# Patient Record
Sex: Female | Born: 1985 | Race: Black or African American | Hispanic: No | Marital: Married | Smoking: Never smoker
Health system: Southern US, Community
[De-identification: ages and names within clinical notes are randomized; demographics above are authoritative.]

## PROBLEM LIST (undated history)

## (undated) DIAGNOSIS — E119 Type 2 diabetes mellitus without complications: Secondary | ICD-10-CM

## (undated) DIAGNOSIS — R569 Unspecified convulsions: Secondary | ICD-10-CM

## (undated) DIAGNOSIS — E785 Hyperlipidemia, unspecified: Secondary | ICD-10-CM

## (undated) DIAGNOSIS — J302 Other seasonal allergic rhinitis: Secondary | ICD-10-CM

## (undated) DIAGNOSIS — F419 Anxiety disorder, unspecified: Secondary | ICD-10-CM

## (undated) HISTORY — DX: Hyperlipidemia, unspecified: E78.5

## (undated) HISTORY — DX: Anxiety disorder, unspecified: F41.9

## (undated) HISTORY — DX: Unspecified convulsions: R56.9

## (undated) HISTORY — PX: INNER EAR SURGERY: SHX679

## (undated) HISTORY — DX: Other seasonal allergic rhinitis: J30.2

---

## 2011-03-10 ENCOUNTER — Emergency Department: Payer: Self-pay

## 2012-05-11 ENCOUNTER — Emergency Department: Payer: Self-pay | Admitting: Emergency Medicine

## 2012-05-11 LAB — URINALYSIS, COMPLETE
Bilirubin,UR: NEGATIVE
Blood: NEGATIVE
Glucose,UR: >=500 mg/dL (ref 0–75)
Ketone: NEGATIVE
Leukocyte Esterase: NEGATIVE
Nitrite: NEGATIVE
Ph: 6 (ref 4.5–8.0)
Protein: NEGATIVE
RBC,UR: <1 /HPF (ref 0–5)
Specific Gravity: 1.031 (ref 1.003–1.030)
Squamous Epithelial: 2
WBC UR: 1 /HPF (ref 0–5)

## 2012-05-12 LAB — CBC
MCH: 24 pg — ABNORMAL LOW (ref 26.0–34.0)
MCV: 80 fL (ref 80–100)
Platelet: 258 10*3/uL (ref 150–440)
RBC: 4.66 10*6/uL (ref 3.80–5.20)
RDW: 16.6 % — ABNORMAL HIGH (ref 11.5–14.5)
WBC: 10.1 10*3/uL (ref 3.6–11.0)

## 2012-05-12 LAB — COMPREHENSIVE METABOLIC PANEL
Albumin: 3.2 g/dL — ABNORMAL LOW (ref 3.4–5.0)
Alkaline Phosphatase: 95 U/L (ref 50–136)
Anion Gap: 7 (ref 7–16)
BUN: 7 mg/dL (ref 7–18)
Calcium, Total: 8.6 mg/dL (ref 8.5–10.1)
Glucose: 332 mg/dL — ABNORMAL HIGH (ref 65–99)
Osmolality: 281 (ref 275–301)
Potassium: 3.6 mmol/L (ref 3.5–5.1)
SGOT(AST): 31 U/L (ref 15–37)
Sodium: 135 mmol/L — ABNORMAL LOW (ref 136–145)
Total Protein: 8.1 g/dL (ref 6.4–8.2)

## 2012-05-12 LAB — PREGNANCY, URINE: Pregnancy Test, Urine: NEGATIVE m[IU]/mL

## 2012-05-12 LAB — LIPASE, BLOOD: Lipase: 199 U/L (ref 73–393)

## 2012-08-29 ENCOUNTER — Ambulatory Visit: Payer: Self-pay | Admitting: Family Medicine

## 2012-09-09 ENCOUNTER — Ambulatory Visit: Payer: Self-pay | Admitting: Family Medicine

## 2012-09-23 ENCOUNTER — Ambulatory Visit: Payer: Self-pay | Admitting: Neurology

## 2012-10-05 ENCOUNTER — Emergency Department: Payer: Self-pay | Admitting: Emergency Medicine

## 2012-10-05 LAB — COMPREHENSIVE METABOLIC PANEL
Albumin: 3.3 g/dL — ABNORMAL LOW (ref 3.4–5.0)
Alkaline Phosphatase: 72 U/L (ref 50–136)
BUN: 10 mg/dL (ref 7–18)
Calcium, Total: 8.8 mg/dL (ref 8.5–10.1)
Creatinine: 0.78 mg/dL (ref 0.60–1.30)
EGFR (African American): >60
EGFR (Non-African Amer.): >60
Glucose: 138 mg/dL — ABNORMAL HIGH (ref 65–99)
Osmolality: 277 (ref 275–301)
Potassium: 3.8 mmol/L (ref 3.5–5.1)
SGOT(AST): 23 U/L (ref 15–37)
Sodium: 138 mmol/L (ref 136–145)
Total Protein: 8.3 g/dL — ABNORMAL HIGH (ref 6.4–8.2)

## 2012-10-05 LAB — DRUG SCREEN, URINE
Barbiturates, Ur Screen: NEGATIVE (ref ?–200)
Cannabinoid 50 Ng, Ur ~~LOC~~: NEGATIVE (ref ?–50)
Cocaine Metabolite,Ur ~~LOC~~: NEGATIVE (ref ?–300)
Methadone, Ur Screen: NEGATIVE (ref ?–300)
Opiate, Ur Screen: NEGATIVE (ref ?–300)
Phencyclidine (PCP) Ur S: NEGATIVE (ref ?–25)
Tricyclic, Ur Screen: NEGATIVE (ref ?–1000)

## 2012-10-05 LAB — URINALYSIS, COMPLETE
Bacteria: NONE SEEN
Bilirubin,UR: NEGATIVE
Blood: NEGATIVE
Glucose,UR: NEGATIVE mg/dL (ref 0–75)
Leukocyte Esterase: NEGATIVE
Ph: 5 (ref 4.5–8.0)
Protein: 30
Specific Gravity: 1.029 (ref 1.003–1.030)
Squamous Epithelial: 2

## 2012-10-05 LAB — CBC
MCH: 24.5 pg — ABNORMAL LOW (ref 26.0–34.0)
MCHC: 31.6 g/dL — ABNORMAL LOW (ref 32.0–36.0)
MCV: 78 fL — ABNORMAL LOW (ref 80–100)
Platelet: 381 10*3/uL (ref 150–440)
WBC: 11.7 10*3/uL — ABNORMAL HIGH (ref 3.6–11.0)

## 2012-10-08 ENCOUNTER — Ambulatory Visit: Payer: Self-pay | Admitting: Neurology

## 2013-04-11 ENCOUNTER — Emergency Department: Payer: Self-pay | Admitting: Emergency Medicine

## 2013-04-11 LAB — URINALYSIS, COMPLETE
Bacteria: NONE SEEN
Blood: NEGATIVE
Glucose,UR: NEGATIVE mg/dL (ref 0–75)
Ketone: NEGATIVE
Nitrite: NEGATIVE
Ph: 5 (ref 4.5–8.0)
Squamous Epithelial: 3

## 2013-04-11 LAB — CBC
MCHC: 32 g/dL (ref 32.0–36.0)
MCV: 76 fL — ABNORMAL LOW (ref 80–100)
RBC: 4.18 10*6/uL (ref 3.80–5.20)
RDW: 16.5 % — ABNORMAL HIGH (ref 11.5–14.5)
WBC: 12.9 10*3/uL — ABNORMAL HIGH (ref 3.6–11.0)

## 2013-04-11 LAB — COMPREHENSIVE METABOLIC PANEL
Albumin: 3 g/dL — ABNORMAL LOW (ref 3.4–5.0)
Anion Gap: 6 — ABNORMAL LOW (ref 7–16)
Bilirubin,Total: <0.1 mg/dL — ABNORMAL LOW (ref 0.2–1.0)
Chloride: 105 mmol/L (ref 98–107)
Co2: 23 mmol/L (ref 21–32)
Creatinine: 0.57 mg/dL — ABNORMAL LOW (ref 0.60–1.30)
EGFR (African American): >60
EGFR (Non-African Amer.): >60
Glucose: 100 mg/dL — ABNORMAL HIGH (ref 65–99)
Osmolality: 268 (ref 275–301)
Potassium: 3.5 mmol/L (ref 3.5–5.1)
SGOT(AST): 12 U/L — ABNORMAL LOW (ref 15–37)
SGPT (ALT): 15 U/L (ref 12–78)
Sodium: 134 mmol/L — ABNORMAL LOW (ref 136–145)
Total Protein: 8.1 g/dL (ref 6.4–8.2)

## 2014-05-19 IMAGING — CT CT ABD-PELV W/O CM
1 of 2 series · 15 of 32 positions shown, 19 images · non-contrast
Comparison: none

REASON FOR EXAM: (1) LLQ/RLQ pain; (2) LLQ/RLQ pain
COMMENTS:

[Series 2: 3mm soft tissue · axial · 0.91mm/px · z∈[-508,-73]mm · 15 of 159 slices shown, 19 images]
[im 7/159  soft-tissue]
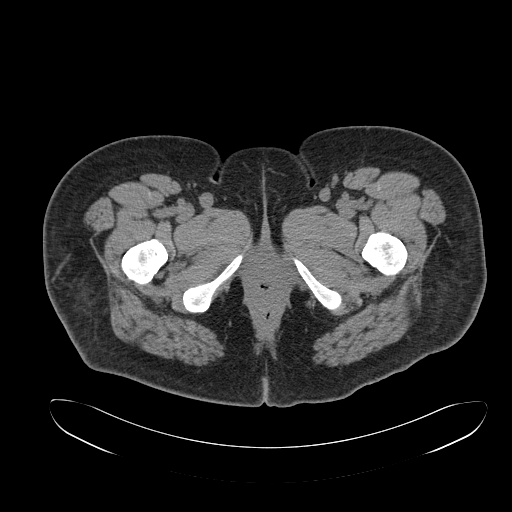
[im 7/159  bone]
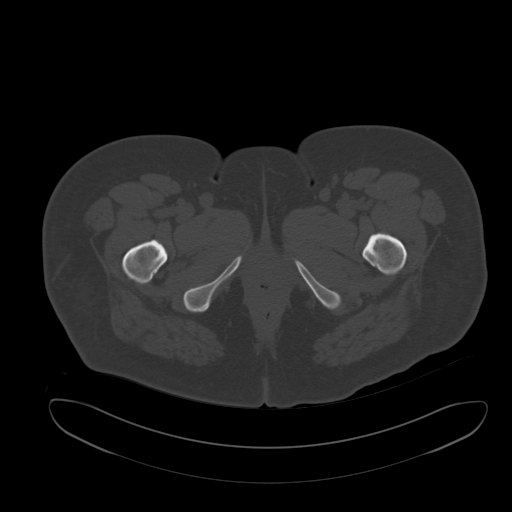
[im 21/159  soft-tissue]
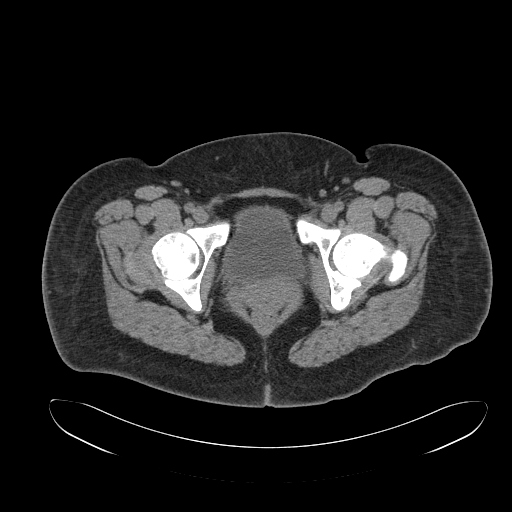
[im 35/159  soft-tissue]
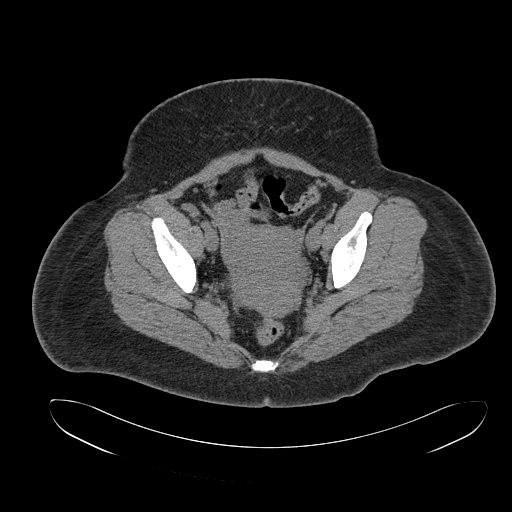
[im 42/159  soft-tissue]
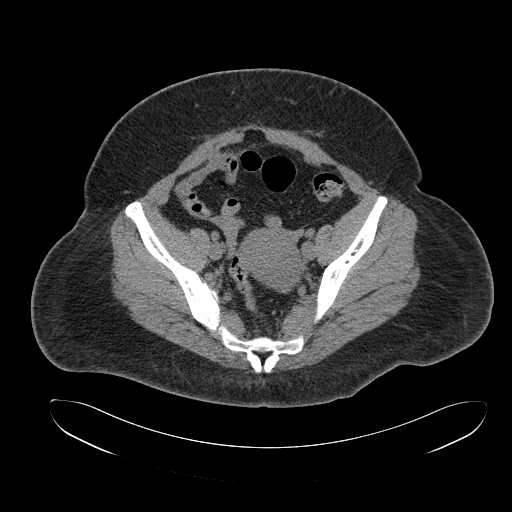
[im 55/159  soft-tissue]
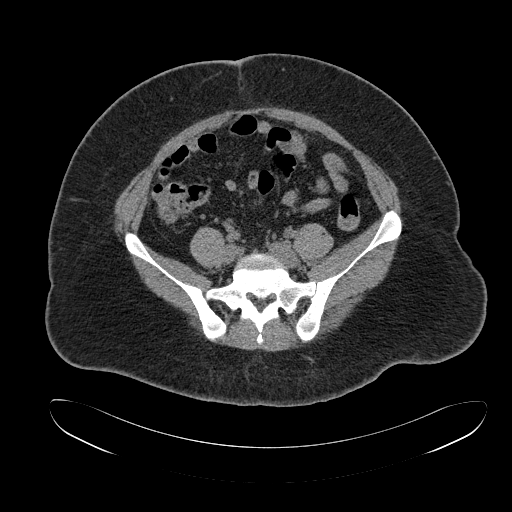
[im 69/159  soft-tissue]
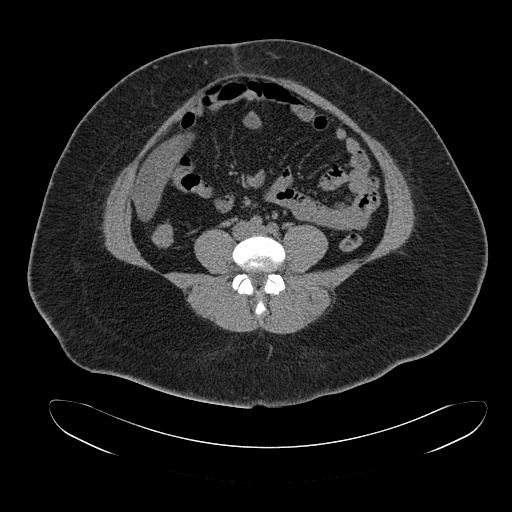
[im 83/159  soft-tissue]
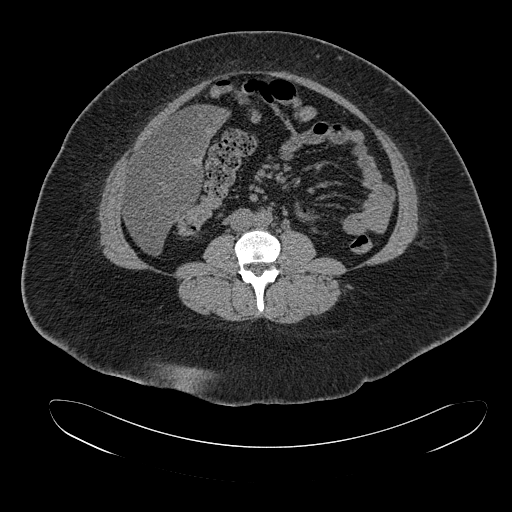
[im 90/159  soft-tissue]
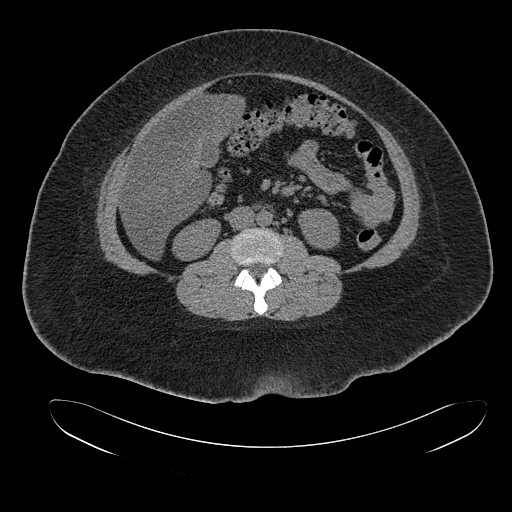
[im 104/159  soft-tissue]
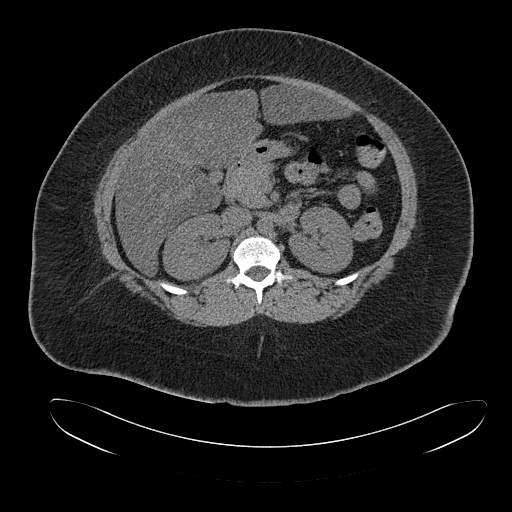
[im 104/159  bone]
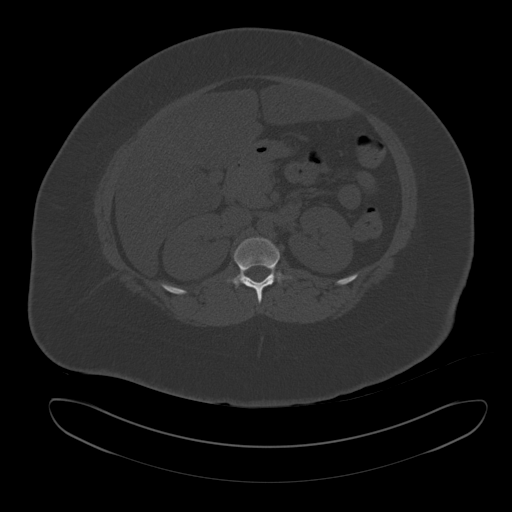
[im 117/159  soft-tissue]
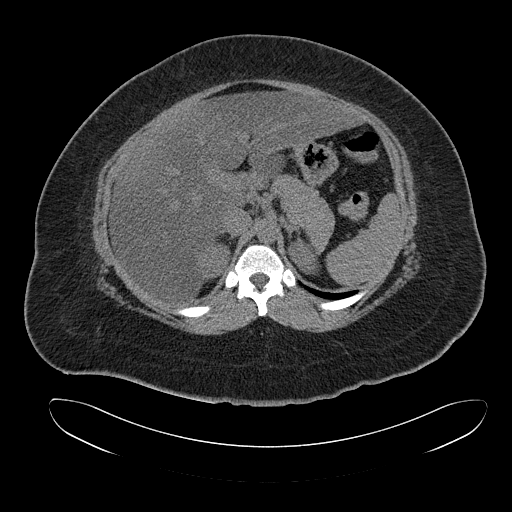
[im 124/159  soft-tissue]
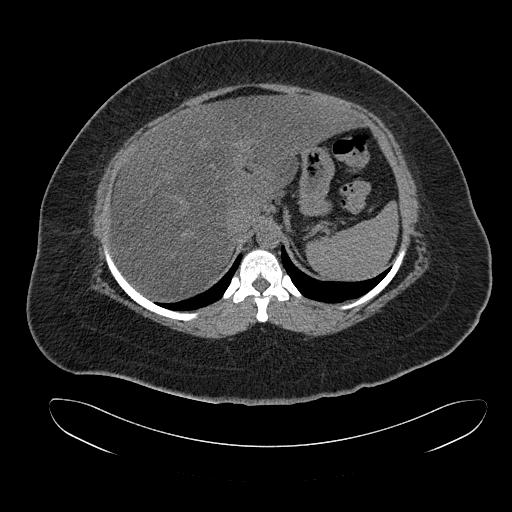
[im 131/159  lung]
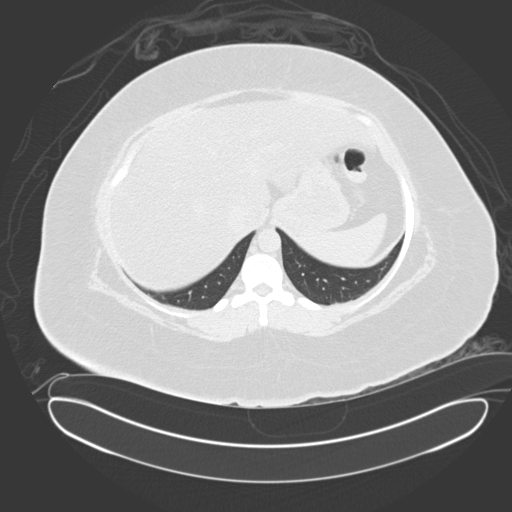
[im 138/159  soft-tissue]
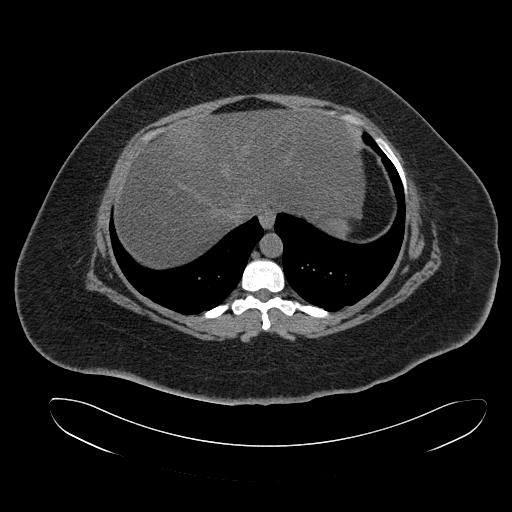
[im 138/159  lung]
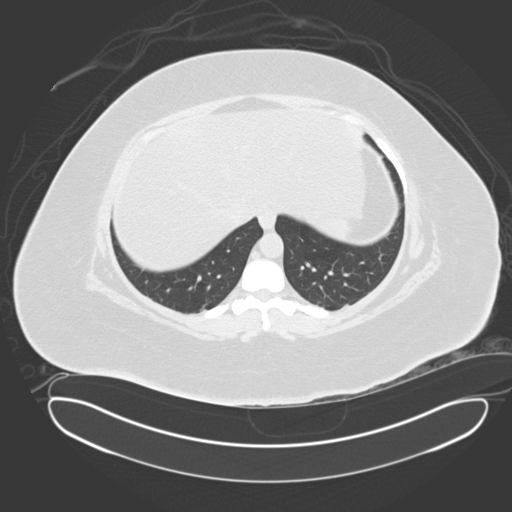
[im 145/159  lung]
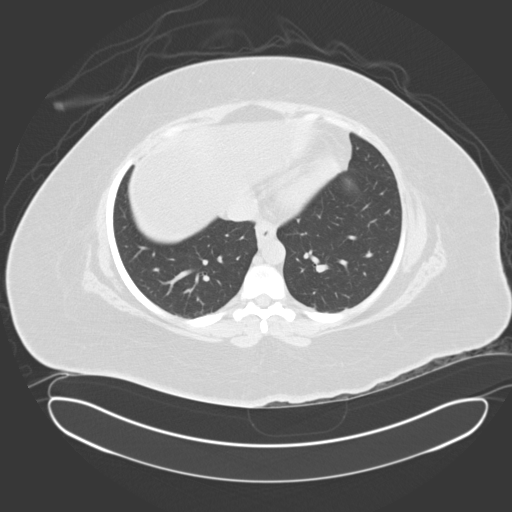
[im 152/159  soft-tissue]
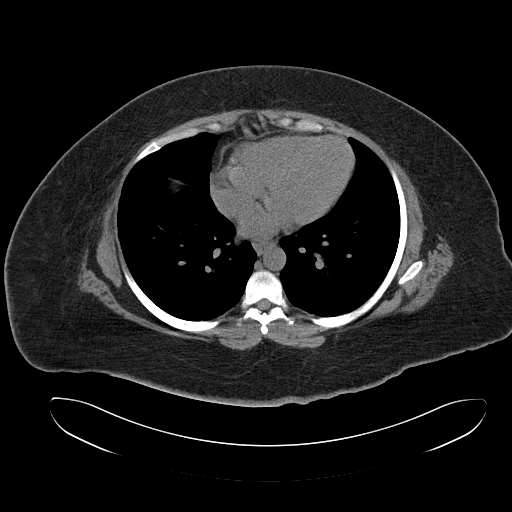
[im 152/159  lung]
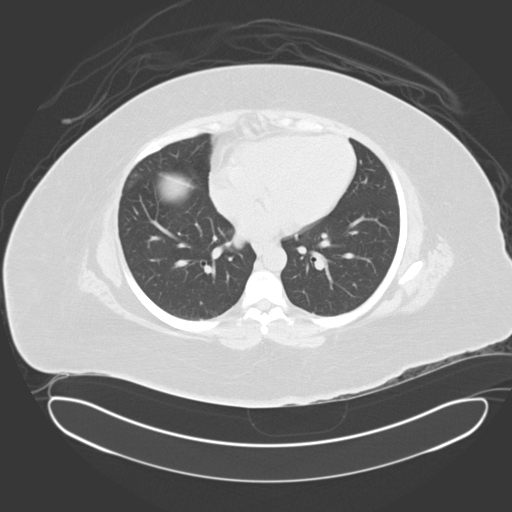

[15 of 32 positions shown; findings below may reference images not displayed]

PROCEDURE:     CT  - CT ABDOMEN AND PELVIS W[DATE]  [DATE]

RESULT:     Axial noncontrast CT scanning was performed through the abdomen
and pelvis with reconstructions at 3 mm intervals and slice thicknesses.
Review of multiplanar reconstructed images was performed separately on the
VIA monitor.

The liver exhibits decreased density diffusely consistent with fatty
infiltration. No focal mass is demonstrated and there is no intrahepatic
ductal dilation. The gallbladder is adequately distended with no evidence of
stones. The pancreas, spleen, nondistended stomach, adrenal glands, and
kidneys exhibit no abnormality on this noncontrast exam. The caliber of the
abdominal aorta is normal. The unopacified loops of small and large bowel
exhibit no evidence of ileus nor of obstruction. There is a structure that
is felt to reflect a normal appendix demonstrated on images 100 through 113.
The uterus and adnexal structures exhibit no acute abnormality. The
partially distended urinary bladder is normal in appearance area there is no
inguinal nor umbilical hernia.

The lung bases are clear. The lumbar vertebral bodies are preserved in
height. The psoas muscles exhibit no evidence of abscess or inflammation.
IMPRESSION: 1. There are fatty infiltrative changes of the liver. I do not see acute
hepatobiliary abnormality.
2. There is no acute urinary tract abnormality.
3. There is no evidence of small or large bowel obstruction or ileus or
gastroenteritis. There are no findings to suggest colitis or diverticulitis.

If the patient's symptoms persist and remain unexplained, a contrast
enhanced CT scan may be useful.

A preliminary report was sent to the [HOSPITAL] the conclusion
of the study.

## 2015-03-07 ENCOUNTER — Emergency Department
Admit: 2015-03-07 | Disposition: A | Discharge status: Home / Self Care | Payer: Self-pay | Admitting: Emergency Medicine

## 2015-03-07 LAB — URINALYSIS, COMPLETE
BACTERIA: NONE SEEN
Bilirubin,UR: NEGATIVE
Blood: NEGATIVE
Glucose,UR: >=500 mg/dL (ref 0–75)
Leukocyte Esterase: NEGATIVE
Nitrite: NEGATIVE
Ph: 6 (ref 4.5–8.0)
Protein: NEGATIVE
Specific Gravity: 1.035 (ref 1.003–1.030)

## 2015-03-07 LAB — CBC
HCT: 38.5 % (ref 35.0–47.0)
HGB: 12.2 g/dL (ref 12.0–16.0)
MCH: 25.2 pg — ABNORMAL LOW (ref 26.0–34.0)
MCHC: 31.7 g/dL — AB (ref 32.0–36.0)
MCV: 80 fL (ref 80–100)
Platelet: 306 10*3/uL (ref 150–440)
RBC: 4.84 10*6/uL (ref 3.80–5.20)
RDW: 15.3 % — ABNORMAL HIGH (ref 11.5–14.5)
WBC: 10.3 10*3/uL (ref 3.6–11.0)

## 2015-03-07 LAB — COMPREHENSIVE METABOLIC PANEL
ALBUMIN: 3.6 g/dL
ANION GAP: 7 (ref 7–16)
Alkaline Phosphatase: 65 U/L
BUN: 10 mg/dL
Bilirubin,Total: 0.4 mg/dL
CREATININE: 0.61 mg/dL
Calcium, Total: 9 mg/dL
Chloride: 101 mmol/L
Co2: 25 mmol/L
EGFR (African American): >60
EGFR (Non-African Amer.): >60
Glucose: 434 mg/dL — ABNORMAL HIGH
Potassium: 4.1 mmol/L
SGOT(AST): 23 U/L
SGPT (ALT): 17 U/L
Sodium: 133 mmol/L — ABNORMAL LOW
TOTAL PROTEIN: 7.8 g/dL

## 2015-11-11 LAB — HM PAP SMEAR: HM PAP: NORMAL

## 2016-04-13 ENCOUNTER — Encounter: Payer: Self-pay | Admitting: Emergency Medicine

## 2016-04-13 ENCOUNTER — Emergency Department
Admission: EM | Admit: 2016-04-13 | Discharge: 2016-04-13 | Disposition: A | Discharge status: Home / Self Care | Payer: Managed Care, Other (non HMO) | Attending: Emergency Medicine | Admitting: Emergency Medicine

## 2016-04-13 DIAGNOSIS — J069 Acute upper respiratory infection, unspecified: Secondary | ICD-10-CM | POA: Insufficient documentation

## 2016-04-13 DIAGNOSIS — E119 Type 2 diabetes mellitus without complications: Secondary | ICD-10-CM | POA: Diagnosis not present

## 2016-04-13 DIAGNOSIS — R05 Cough: Secondary | ICD-10-CM | POA: Diagnosis present

## 2016-04-13 HISTORY — DX: Type 2 diabetes mellitus without complications: E11.9

## 2016-04-13 MED ORDER — AZITHROMYCIN 250 MG PO TABS: ORAL_TABLET | ORAL | Status: DC

## 2016-04-13 MED ORDER — IBUPROFEN 800 MG PO TABS: 800.0000 mg | ORAL_TABLET | Freq: Three times a day (TID) | ORAL | Status: DC | PRN

## 2016-04-13 MED ORDER — GUAIFENESIN-CODEINE 100-10 MG/5ML PO SOLN: 10.0000 mL | ORAL | Status: DC | PRN

## 2016-04-13 NOTE — ED Notes (Signed)
Cough and body aches x 3 days. Possible fevers.

## 2016-04-13 NOTE — ED Notes (Signed)
States she developed cough with body aches and some fever about 3 days   Afebrile on arrival

## 2016-04-13 NOTE — ED Provider Notes (Signed)
[facility] Emergency Department Provider Note  ____________________________________________  Time seen: Approximately 10:13 AM  I have reviewed the triage vital signs and the nursing notes.   HISTORY  Chief Complaint Cough    HPI Tamara Tate is a 30 y.o. female since for evaluation of her upper respiratory symptoms. Runny nose congestion drainage and dizziness with change of motion. 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 Positive for fever and chills per her history. States the cough body and on for about 3 days now no relief with over-the-counter medications    Past Medical History  Diagnosis Date  . Diabetes mellitus without complication (HCC)     There are no active problems to display for this patient.   History reviewed. No pertinent past surgical history.  Current Outpatient Rx  Name  Route  Sig  Dispense  Refill  . azithromycin (ZITHROMAX Z-PAK) 250 MG tablet      Take 2 tablets (500 mg) on  Day 1,  followed by 1 tablet (250 mg) once daily on Days 2 through 5.   6 each   0   . guaiFENesin-codeine 100-10 MG/5ML syrup   Oral   Take 10 mLs by mouth every 4 (four) hours as needed for cough.   180 mL   0   . ibuprofen (ADVIL,MOTRIN) 800 MG tablet   Oral   Take 1 tablet (800 mg total) by mouth every 8 (eight) hours as needed.   30 tablet   0     Allergies Review of patient's allergies indicates no known allergies.  No family history on file.  Social History Social History  Substance Use Topics  . Smoking status: Never Smoker   . Smokeless tobacco: None  . Alcohol Use: Yes    Review of Systems Constitutional: Possible fever/chills Eyes: No visual changes. ENT: Positive sore throat probably secondary to drainage per patient has a for runny nose and drainage. Cardiovascular: Denies chest pain. Respiratory: Denies shortness of breath. Positive for  cough Gastrointestinal: No abdominal pain.  No nausea, no vomiting.  No diarrhea.  No constipation. Genitourinary: Negative for dysuria. Musculoskeletal: Negative for back pain. Skin: Negative for rash. Neurological: Negative for headaches, focal weakness or numbness.  10-point ROS otherwise negative.  ____________________________________________   PHYSICAL EXAM:  VITAL SIGNS: ED Triage Vitals  Enc Vitals Group     BP 04/13/16 0933 124/81 mmHg     Pulse Rate 04/13/16 0933 73     Resp 04/13/16 0933 18     Temp 04/13/16 0933 98.3 F (36.8 C)     Temp Source 04/13/16 0933 Oral     SpO2 04/13/16 0933 98 %     Weight 04/13/16 0933 190 lb (86.183 kg)     Height 04/13/16 0933 5\' 2"  (1.575 m)     Head Cir --      Peak Flow --      Pain Score --      Pain Loc --      Pain Edu? --      Excl. in GC? --     Constitutional: Alert and oriented. Well appearing and in no acute distress. Head: Atraumatic. Nose: Mild congestion/rhinnorhea. Mouth/Throat: Mucous membranes are moist.  Oropharynx non-erythematous. Neck: No stridor.  Full range of motion nontender Cardiovascular: Normal rate, regular rhythm. Grossly normal heart sounds.  Good peripheral circulation. Respiratory: Normal respiratory effort.  No retractions. Lungs CTAB. Musculoskeletal:  No lower extremity tenderness nor edema.  No joint effusions. Neurologic:  Normal speech and language. No gross focal neurologic deficits are appreciated. No gait instability. Skin:  Skin is warm, dry and intact. No rash noted. Psychiatric: Mood and affect are normal. Speech and behavior are normal.  ____________________________________________   LABS (all labs ordered are listed, but only abnormal results are displayed)  Labs Reviewed - No data to  display ____________________________________________  EKG   ____________________________________________  RADIOLOGY   ____________________________________________   PROCEDURES  Procedure(s) performed: None  Critical Care performed: No  ____________________________________________   INITIAL IMPRESSION / ASSESSMENT AND PLAN / ED COURSE  Pertinent labs & imaging results that were available during my care of the patient were reviewed by me and considered in my medical decision making (see chart for details).  Acute upper respiratory infection. Rx given for Zithromax, Robitussin-AC, Motrin 800 mg 3 times a day. Patient follow-up PCP or return to ER with any worsening symptomology. Work excuse 48 hours given. ____________________________________________   FINAL CLINICAL IMPRESSION(S) / ED DIAGNOSES  Final diagnoses:  URI, acute     This chart was dictated using voice recognition software/Dragon. Despite best efforts to proofread, errors can occur which can change the meaning. Any change was purely unintentional.   Evangeline Dakin, PA-C 04/13/16 1038  Jene Every, MD 04/13/16 [phone]

## 2016-04-13 NOTE — Discharge Instructions (Signed)
Upper Respiratory Infection, Adult Most upper respiratory infections (URIs) are a viral infection of the air passages leading to the lungs. A URI affects the nose, throat, and upper air passages. The most common type of URI is nasopharyngitis and is typically referred to as "the common cold." URIs run their course and usually go away on their own. Most of the time, a URI does not require medical attention, but sometimes a bacterial infection in the upper airways can follow a viral infection. This is called a secondary infection. Sinus and middle ear infections are common types of secondary upper respiratory infections. Bacterial pneumonia can also complicate a URI. A URI can worsen asthma and chronic obstructive pulmonary disease (COPD). Sometimes, these complications can require emergency medical care and may be life threatening.  CAUSES Almost all URIs are caused by viruses. A virus is a type of germ and can spread from one person to another.  RISKS FACTORS You may be at risk for a URI if:   You smoke.   You have chronic heart or lung disease.  You have a weakened defense (immune) system.   You are very young or very old.   You have nasal allergies or asthma.  You work in crowded or poorly ventilated areas.  You work in health care facilities or schools. SIGNS AND SYMPTOMS  Symptoms typically develop 2-3 days after you come in contact with a cold virus. Most viral URIs last 7-10 days. However, viral URIs from the influenza virus (flu virus) can last 14-18 days and are typically more severe. Symptoms may include:   Runny or stuffy (congested) nose.   Sneezing.   Cough.   Sore throat.   Headache.   Fatigue.   Fever.   Loss of appetite.   Pain in your forehead, behind your eyes, and over your cheekbones (sinus pain).  Muscle aches.  DIAGNOSIS  Your health care provider may diagnose a URI by:  Physical exam.  Tests to check that your symptoms are not due to  another condition such as:  Strep throat.  Sinusitis.  Pneumonia.  Asthma. TREATMENT  A URI goes away on its own with time. It cannot be cured with medicines, but medicines may be prescribed or recommended to relieve symptoms. Medicines may help:  Reduce your fever.  Reduce your cough.  Relieve nasal congestion. HOME CARE INSTRUCTIONS   Take medicines only as directed by your health care provider.   Gargle warm saltwater or take cough drops to comfort your throat as directed by your health care provider.  Use a warm mist humidifier or inhale steam from a shower to increase air moisture. This may make it easier to breathe.  Drink enough fluid to keep your urine clear or pale yellow.   Eat soups and other clear broths and maintain good nutrition.   Rest as needed.   Return to work when your temperature has returned to normal or as your health care provider advises. You may need to stay home longer to avoid infecting others. You can also use a face mask and careful hand washing to prevent spread of the virus.  Increase the usage of your inhaler if you have asthma.   Do not use any tobacco products, including cigarettes, chewing tobacco, or electronic cigarettes. If you need help quitting, ask your health care provider. PREVENTION  The best way to protect yourself from getting a cold is to practice good hygiene.   Avoid oral or hand contact with people with cold   symptoms.   Wash your hands often if contact occurs.  There is no clear evidence that vitamin C, vitamin E, echinacea, or exercise reduces the chance of developing a cold. However, it is always recommended to get plenty of rest, exercise, and practice good nutrition.  SEEK MEDICAL CARE IF:   You are getting worse rather than better.   Your symptoms are not controlled by medicine.   You have chills.  You have worsening shortness of breath.  You have brown or red mucus.  You have yellow or brown nasal  discharge.  You have pain in your face, especially when you bend forward.  You have a fever.  You have swollen neck glands.  You have pain while swallowing.  You have white areas in the back of your throat. SEEK IMMEDIATE MEDICAL CARE IF:   You have severe or persistent:  Headache.  Ear pain.  Sinus pain.  Chest pain.  You have chronic lung disease and any of the following:  Wheezing.  Prolonged cough.  Coughing up blood.  A change in your usual mucus.  You have a stiff neck.  You have changes in your:  Vision.  Hearing.  Thinking.  Mood. MAKE SURE YOU:   Understand these instructions.  Will watch your condition.  Will get help right away if you are not doing well or get worse.   This information is not intended to replace advice given to you by your health care provider. Make sure you discuss any questions you have with your health care provider.   Document Released: 04/21/2001 Document Revised: 03/12/2015 Document Reviewed: 01/31/2014 Elsevier Interactive Patient Education 2016 [employer]  

## 2016-08-25 ENCOUNTER — Telehealth: Payer: Self-pay | Admitting: Unknown Physician Specialty

## 2016-08-25 NOTE — Telephone Encounter (Signed)
Pt called to make an appt to get work clearance for work. Appt was bad for pt.

## 2016-08-26 ENCOUNTER — Telehealth: Payer: Self-pay

## 2016-08-26 ENCOUNTER — Ambulatory Visit (INDEPENDENT_AMBULATORY_CARE_PROVIDER_SITE_OTHER): Payer: Managed Care, Other (non HMO) | Admitting: Family Medicine

## 2016-08-26 ENCOUNTER — Encounter: Payer: Self-pay | Admitting: Family Medicine

## 2016-08-26 VITALS — BP 108/74 | HR 85 | Temp 98.5°F | Ht 63.1 in | Wt 196.9 lb

## 2016-08-26 DIAGNOSIS — E1165 Type 2 diabetes mellitus with hyperglycemia: Secondary | ICD-10-CM

## 2016-08-26 DIAGNOSIS — Z794 Long term (current) use of insulin: Secondary | ICD-10-CM | POA: Diagnosis not present

## 2016-08-26 DIAGNOSIS — R569 Unspecified convulsions: Secondary | ICD-10-CM | POA: Diagnosis not present

## 2016-08-26 MED ORDER — METFORMIN HCL ER 500 MG PO TB24
500.0000 mg | ORAL_TABLET | Freq: Every day | ORAL | 0 refills | Status: DC
Start: 2016-08-26 — End: 2024-09-01

## 2016-08-26 NOTE — Telephone Encounter (Signed)
No answer

## 2016-08-26 NOTE — Telephone Encounter (Signed)
[facility] returned call about new patient appointments. They can see her on 09/10/16 at 9:30am with Dr. Anne HahnWillis.  I am leaving both this appointment and [facility] scheduled until I speak with patient and see if she's ok with going to [place].

## 2016-08-26 NOTE — Progress Notes (Addendum)
BP 108/74 (BP Location: Right Arm, Patient Position: Sitting, Cuff Size: Large)   Pulse 85   Temp 98.5 F (36.9 C)   Ht 5' 3.1" (1.603 m)   Wt 196 lb 14.4 oz (89.3 kg)   LMP 08/24/2016   SpO2 100%   BMI 34.77 kg/m    Subjective:    Patient ID: Tamara Tate, female    DOB: 1986-10-27, 30 y.o.   MRN: [id number]  HPI: Tamara Roughenavira Nanez is a 30 y.o. female  Chief Complaint  Patient presents with  . Seizures   Patient presents to discuss work clearance following a seizure she had while at work 2 weeks ago. She declined going to the ER and states she has had no issues since the incident. Has been going about her life as usual since. Had first seizure about 3 years ago, at which time she had a full Neurology workup. Was placed on Keppra, but self - d/c'ed the medication due to suicidal ideation shortly after. This is the first seizure she has had since this time. Has been lost to follow up with Neurology since.   Also lost to follow up with endocrinology to manage her uncontrolled DM2 for about a year. She has been off all medicines for quite some time now, stating that her insurance changed and she is just now getting into the doctor since. Previously on metformin, novolog, and lantus. Most recent A1C in Sept 2016 at 10.5.   Relevant past medical, surgical, family and social history reviewed and updated as indicated. Interim medical history since our last visit reviewed. Allergies and medications reviewed and updated.  Review of Systems  Constitutional: Negative.   HENT: Negative.   Respiratory: Negative.   Cardiovascular: Negative.   Gastrointestinal: Negative.   Genitourinary: Negative.   Musculoskeletal: Negative.   Skin: Negative.   Neurological: Positive for seizures.  Psychiatric/Behavioral: Negative.     Per HPI unless specifically indicated above     Objective:    BP 108/74 (BP Location: Right Arm, Patient Position: Sitting, Cuff Size: Large)   Pulse 85   Temp 98.5 F  (36.9 C)   Ht 5' 3.1" (1.603 m)   Wt 196 lb 14.4 oz (89.3 kg)   LMP 08/24/2016   SpO2 100%   BMI 34.77 kg/m   Wt Readings from Last 3 Encounters:  08/26/16 196 lb 14.4 oz (89.3 kg)  04/13/16 190 lb (86.2 kg)    Physical Exam  Constitutional: She is oriented to person, place, and time. She appears well-developed and well-nourished. No distress.  HENT:  Head: Atraumatic.  Eyes: Conjunctivae and EOM are normal. Pupils are equal, round, and reactive to light. No scleral icterus.  Neck: Normal range of motion. Neck supple.  Cardiovascular: Normal rate.   Pulmonary/Chest: Effort normal and breath sounds normal. No respiratory distress.  Musculoskeletal: Normal range of motion.  Neurological: She is alert and oriented to person, place, and time. No cranial nerve deficit. She exhibits normal muscle tone. Coordination normal.  Skin: Skin is warm and dry.  Psychiatric: She has a normal mood and affect. Her behavior is normal.  Nursing note and vitals reviewed.     Assessment & Plan:   Problem List Items Addressed This Visit    None    Visit Diagnoses    Seizure (HCC)    -  Primary   Referral placed back to neurology, will try getting her an appt ASAP as she is not currently on medication d/t side effects.  Relevant Orders   Ambulatory referral to Neurology   Type 2 diabetes mellitus with hyperglycemia, with long-term current use of insulin (HCC)       Stressed importance of immediate follow up with endocrinology to check labs and get back on medications. Will restart her metformin in the meantime   Relevant Medications   metFORMIN (GLUCOPHAGE XR) 500 MG 24 hr tablet    Has clearance paperwork she needs filled out for work. Will forward them to whatever Neurologist will see her first. Will not restart her keppra at this time as she had suicidal thoughts on the medication.   Long discussion that she is not for any reason to drive for the next 6 months following the seizure. She  voiced understanding about this matter.   Follow up plan: Return if symptoms worsen or fail to improve, for due for physical exam.

## 2016-08-26 NOTE — Patient Instructions (Signed)
Follow up with endocrinology and neurology as soon as possible

## 2016-08-26 NOTE — Telephone Encounter (Signed)
I called [facility]. Patient was never seen there. She had 2 new patient appointments, but no showed both of them. It looks like she only saw neuro thru the ER. Since they have not seen her, they can not give any medication advice. I did schedule her for a new patient appointment with Dr.Potter for 10/09/16 at 1pm.

## 2016-08-26 NOTE — Telephone Encounter (Signed)
Please call pt and let her know that I sent in her previous dose of metformin so she can get back on it in the meantime while she is working on getting back into endocrinology. She needs to get back with them ASAP to get back on insulin.   Let her know we are still working on neuro appts but it's looking like they go out about 6 weeks right now. Thanks!

## 2016-08-26 NOTE — Telephone Encounter (Signed)
I spoke with her pharmacy, the only seizure med they had on record was Keppra 750mg  one BID from back in 2013.

## 2016-08-27 NOTE — Telephone Encounter (Signed)
Patient notified about metformin. [facility] Appt. Time given. [facility].

## 2016-09-10 ENCOUNTER — Ambulatory Visit (INDEPENDENT_AMBULATORY_CARE_PROVIDER_SITE_OTHER): Payer: Managed Care, Other (non HMO) | Admitting: Neurology

## 2016-09-10 ENCOUNTER — Encounter: Payer: Self-pay | Admitting: Neurology

## 2016-09-10 DIAGNOSIS — R569 Unspecified convulsions: Secondary | ICD-10-CM | POA: Insufficient documentation

## 2016-09-10 HISTORY — DX: Unspecified convulsions: R56.9

## 2016-09-10 MED ORDER — LAMOTRIGINE 25 MG PO TABS: ORAL_TABLET | ORAL | 2 refills | Status: DC

## 2016-09-10 NOTE — Patient Instructions (Signed)
    With the Lamictal 25 mg tablet, start one tablet twice a day for 2 weeks, then take 2 tablets twice a day for 2 weeks, then take 3 tablets twice a day and then call our office after 2 weeks, we will call in a 100 mg tablet prescription for this medication.  Lamictal (lamotrigine) is a seizure medication that occasionally may be used for other purposes such as peripheral neuropathy pain or certain types of headache. This medication is relatively safe, but occasionally side effects can occur. A skin rash may occur when first starting the medication. As with any seizure medication, depression may worsen on the drug. Other potential side effects include dizziness, headache, drowsiness or insomnia, decreased concentration, or stomach upset. This medication may also be used as a mood stabilizer. If you believe that you are having side effects on the medication, please contact our office.

## 2016-09-10 NOTE — Progress Notes (Signed)
Reason for visit: Seizures  Referring physician: Dr. Delight StareLane  Tamara Tate is a 30 y.o. female  History of present illness:  Tamara Tate is a 30 year old right-handed black female with a history of seizures. The patient had her first seizure about 5 years ago associated with a pregnancy. The patient had 2-3 episodes during her pregnancy. The patient had another episode about 3 years ago that occurred while at home. The patient felt "weird" for brief period of time, she sat down, and then loss consciousness. The patient apparently had a noncontrast MRI of the brain at that time that questioned a lesion in the pituitary gland otherwise the brain was normal. The patient had an EEG study, the patient indicates that the study was normal. She was placed on Keppra but this resulted in suicidal ideation and she stopped the medication. She apparently did not follow-up with her neurologist. The patient has not had any further seizure events until around 08/11/2016. The patient was at work at the time, she again felt somewhat lightheaded and hot. The patient sat down and then apparently lost consciousness. The patient was told that her eyes rolled back and she had some shaking on the arms. She did not bite her tongue or lose control of the bowels or the bladder. The patient has a history of diabetes and she does report episodes of hypoglycemia but she claims that the feeling of hypoglycemia is different from what she feels before the seizures. She denies any pre-existing history of head trauma. She denies any family history of seizures. She reports no numbness or weakness on the face, arms, or legs. She denies any balance problems or difficulty controlling the bowels or the bladder. The patient has had some occasional headaches recently associated with a cold syndrome. The patient is sent to this office for an evaluation.  Past Medical History:  Diagnosis Date  . Anxiety   . Diabetes mellitus without complication  (HCC)   . Hyperlipidemia   . Seasonal allergies   . Seizure [facility](HCC)     Past Surgical History:  Procedure Laterality Date  . CESAREAN SECTION     x 2    Family History  Problem Relation Age of Onset  . Diabetes Mother   . Hypertension Mother   . Asthma Father   . Diabetes Maternal Grandmother   . Cancer Maternal Grandfather     Leukemia  . Cancer Paternal Grandfather     Prostate  . Breast cancer Maternal Aunt     Social history:  reports that she has never smoked. She has never used smokeless tobacco. She reports that she drinks alcohol. She reports that she does not use drugs.  Medications:  Prior to Admission medications   Medication Sig Start Date End Date Taking? Authorizing Provider  metFORMIN (GLUCOPHAGE XR) 500 MG 24 hr tablet Take 1 tablet (500 mg total) by mouth daily with breakfast. 08/26/16   Particia Nearingachel Elizabeth Lane, PA-C      Allergies  Allergen Reactions  . Keppra [Levetiracetam]     Suicidal ideation  . Other Hives    NUTS    ROS:  Out of a complete 14 system review of symptoms, the patient complains only of the following symptoms, and all other reviewed systems are negative.  Allergies Seizures Anxiety  Blood pressure 112/74, pulse 82, height 5\' 2"  (1.575 m), weight 198 lb 8 oz (90 kg), last menstrual period 08/24/2016.  Physical Exam  General: The patient is alert and cooperative at  the time of the examination. The patient is markedly obese.  Eyes: Pupils are equal, round, and reactive to light. Discs are flat bilaterally.  Neck: The neck is supple, no carotid bruits are noted.  Respiratory: The respiratory examination is clear.  Cardiovascular: The cardiovascular examination reveals a regular rate and rhythm, no obvious murmurs or rubs are noted.  Skin: Extremities are without significant edema.  Neurologic Exam  Mental status: The patient is alert and oriented x 3 at the time of the examination. The patient has apparent normal recent  and remote memory, with an apparently normal attention span and concentration ability.  Cranial nerves: Facial symmetry is present. There is good sensation of the face to pinprick and soft touch bilaterally. The strength of the facial muscles and the muscles to head turning and shoulder shrug are normal bilaterally. Speech is well enunciated, no aphasia or dysarthria is noted. Extraocular movements are full. Visual fields are full. The tongue is midline, and the patient has symmetric elevation of the soft palate. No obvious hearing deficits are noted.  Motor: The motor testing reveals 5 over 5 strength of all 4 extremities. Good symmetric motor tone is noted throughout.  Sensory: Sensory testing is intact to pinprick, soft touch, vibration sensation, and position sense on all 4 extremities. No evidence of extinction is noted.  Coordination: Cerebellar testing reveals good finger-nose-finger and heel-to-shin bilaterally.  Gait and station: Gait is normal. Tandem gait is normal. Romberg is negative. No drift is seen.  Reflexes: Deep tendon reflexes are symmetric and normal bilaterally. Toes are downgoing bilaterally.   Assessment/Plan:  1. History of seizures with recent recurrence  The patient has an unremarkable clinical examination today. The prior MRI of the brain was done without contrast, we will set her up for a MRI of the brain with and without gadolinium enhancement. She will have an EEG study. She will be placed on Lamictal, she is to watch out for worsening depression. The patient is not operating a motor vehicle for 6 months from the time of the last seizure. Unfortunately, her job requires that she travel and she therefore may require disability during the 6 month waiting period. The patient will follow-up in 4 months. She will call us if she has any problems. Once she gets to 75 mg of the Lamictal twice daily, she will call for a prescription for the 100 mg tablets.  Marlan Palau. Keith Willis  MD 09/10/2016 9:38 AM  [facility] [address] [place], [place] [id number]  Phone [phone] Fax [phone]

## 2016-09-17 ENCOUNTER — Telehealth: Payer: Self-pay | Admitting: Neurology

## 2016-09-17 NOTE — Telephone Encounter (Signed)
Patient called to check status of [employer] Short Term Disability forms that were faxed to our office last Friday November 3rd. Patient advised once forms are received, if our Doctor is able to fill forms out for patient, there is a $50 form fee and turn around time is 10-14 days.

## 2016-09-18 NOTE — Telephone Encounter (Signed)
Completed STD form, forwarded to Dr.  Anne HahnWillis for completion, signature.

## 2016-09-21 ENCOUNTER — Telehealth: Payer: Self-pay | Admitting: Neurology

## 2016-09-21 NOTE — Telephone Encounter (Signed)
Form has been signed.

## 2016-09-21 NOTE — Telephone Encounter (Signed)
Shoulder x-ray was negative for fracture.

## 2016-09-22 NOTE — Telephone Encounter (Signed)
Forms completed, signed and forwarded to medical records for processing. Called to notify pt. She agreed to call or come by later today to pay $50 form fee. Then, she would like forms to be faxed in.

## 2016-09-23 ENCOUNTER — Telehealth: Payer: Self-pay | Admitting: *Deleted

## 2016-09-23 NOTE — Telephone Encounter (Signed)
I called pt, I was unable to leave a message, pt Tamara Tate  Form faxed today to [phone].

## 2016-09-24 ENCOUNTER — Other Ambulatory Visit: Payer: Managed Care, Other (non HMO)

## 2016-09-24 DIAGNOSIS — Z0289 Encounter for other administrative examinations: Secondary | ICD-10-CM

## 2016-10-07 ENCOUNTER — Telehealth: Payer: Self-pay | Admitting: Neurology

## 2016-10-07 ENCOUNTER — Ambulatory Visit (INDEPENDENT_AMBULATORY_CARE_PROVIDER_SITE_OTHER): Payer: Managed Care, Other (non HMO) | Admitting: Neurology

## 2016-10-07 ENCOUNTER — Ambulatory Visit
Admission: RE | Admit: 2016-10-07 | Discharge: 2016-10-07 | Disposition: A | Discharge status: Home / Self Care | Payer: Managed Care, Other (non HMO) | Source: Ambulatory Visit | Attending: Neurology | Admitting: Neurology

## 2016-10-07 DIAGNOSIS — R569 Unspecified convulsions: Secondary | ICD-10-CM

## 2016-10-07 MED ORDER — GADOBENATE DIMEGLUMINE 529 MG/ML IV SOLN
19.0000 mL | Freq: Once | INTRAVENOUS | Status: AC | PRN
  Administered 2016-10-07: 19 mL via INTRAVENOUS

## 2016-10-07 NOTE — Telephone Encounter (Signed)
I called patient. The EEG study was unremarkable, MRI the brain was done today and the results are pending.  The patient wants the results of the EEG and MRI faxed to [phone] when the results are available.

## 2016-10-07 NOTE — Procedures (Signed)
    History:  Tamara Tate is a 30 year old patient with a history of seizures. The patient will feel "weird" for a brief period time, then she may lose consciousness. The patient last had an event on 08/11/2016. The patient is being evaluated for these events.  This is a routine EEG. No skull defects are noted. Medications include metformin.   EEG classification: Normal awake and drowsy  Description of the recording: The background rhythms of this recording consists of a fairly well modulated medium amplitude alpha rhythm of 10 Hz that is reactive to eye opening and closure. As the record progresses, the patient appears to remain in the waking state throughout the recording. Photic stimulation was performed, resulting in a bilateral and symmetric photic driving response. Hyperventilation was also performed, resulting in a minimal buildup of the background rhythm activities without significant slowing seen. Toward the end of the recording, the patient enters the drowsy state with slight symmetric slowing seen. The patient never enters stage II sleep. At no time during the recording does there appear to be evidence of spike or spike wave discharges or evidence of focal slowing. EKG monitor shows no evidence of cardiac rhythm abnormalities with a heart rate of 78.  Impression: This is a normal EEG recording in the waking and drowsy state. No evidence of ictal or interictal discharges are seen.

## 2016-10-09 ENCOUNTER — Telehealth: Payer: Self-pay | Admitting: Neurology

## 2016-10-09 NOTE — Telephone Encounter (Signed)
I called patient. 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  The EEG and MRI reports will be faxed to the number supplied by the patient.   MRI brain 10/08/16:  IMPRESSION:  Abnormal MRI brain (with and without) demonstrating: 1. 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 No mass effect upon the optic chiasm or cavernous sinuses. 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  2. Mild mucosal thickening in the frontal, maxillary, ethmoid, sphenoid sinuses.  3. Compared to MRI reports from 10/08/12 and 09/23/12, the pituitary lesion appears stable. More accurate comparison can be made if prior images are made available.

## 2016-10-12 NOTE — Telephone Encounter (Signed)
Returned pt TC. Says that entire MRI result was cut off by her answering machine. Reviewed results per Dr. Anne HahnWillis' phone note. Verbalized understanding and appreciation for call.

## 2016-10-12 NOTE — Telephone Encounter (Signed)
Patient is calling to discuss MRI results. °

## 2016-11-05 ENCOUNTER — Telehealth: Payer: Self-pay

## 2016-11-05 NOTE — Telephone Encounter (Signed)
Additional information required for disability per fax from [employer]. Forms completed, signed and returned to medical records for payment/processing.

## 2016-11-09 HISTORY — DX: Maternal care for unspecified type scar from previous cesarean delivery: O34.219

## 2016-11-11 ENCOUNTER — Telehealth: Payer: Self-pay | Admitting: Family Medicine

## 2016-11-11 NOTE — Telephone Encounter (Signed)
Patient states Monia Pouchetna was sending over some paperwork for Roosvelt MaserRachel Lane to fill out regarding her long term disability..  Thank You Clydie BraunKaren

## 2016-11-11 NOTE — Telephone Encounter (Signed)
Patient notified to call her neurologist in regards to having those forms completed.

## 2017-01-08 ENCOUNTER — Telehealth: Payer: Self-pay | Admitting: Neurology

## 2017-01-08 ENCOUNTER — Ambulatory Visit: Payer: Managed Care, Other (non HMO) | Admitting: Neurology

## 2017-01-08 NOTE — Telephone Encounter (Signed)
This patient did not show for a revisit appointment today. 

## 2017-01-12 ENCOUNTER — Encounter: Payer: Self-pay | Admitting: Neurology

## 2017-01-20 ENCOUNTER — Encounter: Payer: Self-pay | Admitting: Neurology

## 2017-01-20 ENCOUNTER — Telehealth: Payer: Self-pay | Admitting: Neurology

## 2017-01-20 ENCOUNTER — Ambulatory Visit (INDEPENDENT_AMBULATORY_CARE_PROVIDER_SITE_OTHER): Payer: Medicaid Other | Admitting: Neurology

## 2017-01-20 VITALS — BP 110/80 | HR 70 | Ht 62.0 in | Wt 201.0 lb

## 2017-01-20 DIAGNOSIS — Z0289 Encounter for other administrative examinations: Secondary | ICD-10-CM

## 2017-01-20 DIAGNOSIS — R569 Unspecified convulsions: Secondary | ICD-10-CM

## 2017-01-20 MED ORDER — LAMOTRIGINE 100 MG PO TABS: 100.0000 mg | ORAL_TABLET | Freq: Two times a day (BID) | ORAL | 3 refills | Status: DC

## 2017-01-20 NOTE — Telephone Encounter (Signed)
Patient called office to reschedule appointment with Dr. Anne HahnWillis, advised patient next available is July/Aug are we able to work patient in sooner?

## 2017-01-20 NOTE — Progress Notes (Signed)
Reason for visit: Seizures  Tamara Tate is an 31 y.o. female  History of present illness:  [name] is a 31 year old right-handed black female with a history of a seizure disorder. The last seizure was on 08/11/2016. The patient has been placed on Lamictal, she is tolerating medication well. She has not had any problems with depression or suicidal ideation on this medication. She has not had any recurrent seizures. She remains on 75 mg twice daily. 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 The patient will have gone 6 months at the beginning of April 2018. If she has no seizures, she may return back to work. She returns for an evaluation. The patient is having headaches and dizziness about once a week, the patient will take Tylenol for this and the headache improves within about an hour. The patient denies any photophobia, phonophobia, or nausea or vomiting.  Past Medical History:  Diagnosis Date  . Anxiety   . Diabetes mellitus without complication (HCC)   . Hyperlipidemia   . Seasonal allergies   . Seizure [facility](HCC)     Past Surgical History:  Procedure Laterality Date  . CESAREAN SECTION     x 2    Family History  Problem Relation Age of Onset  . Diabetes Mother   . Hypertension Mother   . Asthma Father   . Diabetes Maternal Grandmother   . Cancer Maternal Grandfather     Leukemia  . Cancer Paternal Grandfather     Prostate  . Breast cancer Maternal Aunt     Social history:  reports that she has never smoked. She has never used smokeless tobacco. She reports that she drinks alcohol. She reports that she does not use drugs.    Allergies  Allergen Reactions  . Keppra [Levetiracetam]     Suicidal ideation  . Other Hives    NUTS    Medications:  Prior to Admission medications   Medication Sig Start Date End Date Taking? Authorizing Provider  Acetaminophen (TYLENOL PO) Take 1 capsule by mouth as needed.    Yes Historical Provider,  MD  metFORMIN (GLUCOPHAGE XR) 500 MG 24 hr tablet Take 1 tablet (500 mg total) by mouth daily with breakfast. 08/26/16  Yes Particia Nearingachel Elizabeth Lane, PA-C  lamoTRIgine (LAMICTAL) 100 MG tablet Take 1 tablet (100 mg total) by mouth 2 (two) times daily. 01/20/17   York Spanielharles K Willis, MD    ROS:  Out of a complete 14 system review of symptoms, the patient complains only of the following symptoms, and all other reviewed systems are negative.  Fatigue, excessive sweating  Dizziness, headache   Blood pressure 110/80, pulse 70, height 5\' 2"  (1.575 m), weight 201 lb (91.2 kg), SpO2 97 %.  Physical Exam  General: The patient is alert and cooperative at the time of the examination.  Skin: No significant peripheral edema is noted.   Neurologic Exam  Mental status: The patient is alert and oriented x 3 at the time of the examination. The patient has apparent normal recent and remote memory, with an apparently normal attention span and concentration ability.   Cranial nerves: Facial symmetry is present. Speech is normal, no aphasia or dysarthria is noted. Extraocular movements are full. Visual fields are full.  Motor: The patient has good strength in all 4 extremities.  Sensory examination: Soft touch sensation is symmetric on the face, arms, and legs.  Coordination: The patient has good finger-nose-finger and heel-to-shin bilaterally.  Gait and station: The patient has a normal gait. Tandem gait is normal. Romberg is negative. No drift is seen.  Reflexes: Deep tendon reflexes are symmetric.   MRI brain 10/08/16:  IMPRESSION:  Abnormal MRI brain (with and without) demonstrating: 1. Non-enhancing pituitary lesion (8-73mm in diameter), T1 hypointense, T2 hyperintense, T2 flair hyperintense, measuring 8-9 mm in diameter. No mass effect upon the optic chiasm or cavernous sinuses. 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  2. Mild mucosal thickening in the  frontal, maxillary, ethmoid, sphenoid sinuses.  3. Compared to MRI reports from 10/08/12 and 09/23/12, the pituitary lesion appears stable. More accurate comparison can be made if prior images are made available.  * MRI scan images were reviewed online. I agree with the written report.    Assessment/Plan:  1. History of seizures  The patient is doing well at this time on Lamictal, the Lamictal will be increased taking 100 mg twice daily, she will follow-up in 6 months, sooner if needed. If the patient does well into April, she may return to driving and she may return to work in full capacity.  Marlan Palau MD 01/20/2017 2:10 PM  [facility] [address] [place], [place] [address]  Phone [phone] Fax [phone]

## 2017-01-20 NOTE — Telephone Encounter (Signed)
Pt called office back. She is agreeable to 2pm appt, check in 130pm.

## 2017-01-20 NOTE — Telephone Encounter (Signed)
Called and LVM for pt to call back. I had placed pt on hold and went back to billing to ask if they will take medicaid. She used to have [employer].  When I came back, call ended. I advised pt that she can make appt, she will have to bring new card with her. Asked her to call back and let me know if she would like 2pm appt still.

## 2017-02-23 ENCOUNTER — Telehealth: Payer: Self-pay | Admitting: *Deleted

## 2017-02-23 NOTE — Telephone Encounter (Signed)
I called the patient, left a message. The patient apparently indicates that she had a recent seizure, she did not call our office about this. We will need to see her back and make some adjustments in her medications.

## 2017-02-23 NOTE — Telephone Encounter (Signed)
Received fax request to fill out disability paperwork for pt. Pt requesting absence of work beginning 08/21/16.  Noted on paperwork, pt advised she had a seizure Tuesday or Wednesday or last week and she informed MD of episode. I do not see phone note in EPIC regarding documentation of seizure episode. Might have contacted her PCP.

## 2017-02-24 ENCOUNTER — Encounter: Payer: Self-pay | Admitting: Family Medicine

## 2017-02-24 ENCOUNTER — Ambulatory Visit (INDEPENDENT_AMBULATORY_CARE_PROVIDER_SITE_OTHER): Payer: Medicaid Other | Admitting: Family Medicine

## 2017-02-24 VITALS — BP 115/80 | HR 75 | Temp 98.5°F | Wt 197.0 lb

## 2017-02-24 DIAGNOSIS — F339 Major depressive disorder, recurrent, unspecified: Secondary | ICD-10-CM | POA: Diagnosis not present

## 2017-02-24 DIAGNOSIS — G44209 Tension-type headache, unspecified, not intractable: Secondary | ICD-10-CM | POA: Diagnosis not present

## 2017-02-24 MED ORDER — DULOXETINE HCL 20 MG PO CPEP: 20.0000 mg | ORAL_CAPSULE | Freq: Every day | ORAL | 1 refills | Status: DC

## 2017-02-24 NOTE — Patient Instructions (Signed)
Follow up in 4-6 weeks for Physical Exam

## 2017-02-24 NOTE — Assessment & Plan Note (Addendum)
Initial PHQ 9 score of 14, discussed multiple options for management and patient wanting to try Cymbalta. Will start with 20 mg QD and increase after 1 week to BID. F/u in 6 weeks for Physical Exam and medication f/u. Discussed risks and benefits, possible side effects, expectations for timeframe of effect.

## 2017-02-24 NOTE — Progress Notes (Addendum)
BP 115/80   Pulse 75   Temp 98.5 F (36.9 C)   Wt 197 lb (89.4 kg)   LMP 02/11/2017 (Approximate)   SpO2 98%   BMI 36.03 kg/m    Subjective:    Patient ID: Tamara Tate, female    DOB: 1986/11/04, 31 y.o.   MRN: [id number]  HPI: Tamara Tate is a 31 y.o. female   Chief Complaint  Patient presents with  . Dizziness    x 2 weeks, dizzy spells. Happens when she turns her head, gets up too quuick, and sometimes just randomly.  Marland Kitchen Headache    x 2 weeks, starts in the middle of her forehead and then goes to the sides. Tylenol helps.    Patient presents with daily HAs, rated 3-4/10 with a dull throbbing quality, lasting 30 min or so that feel like a band around head. Reports no aggrevating factors. Somewhat relieved by Tylenol and lying down. Denies N/V, photophobia, or phonophobia. She reports no hx of migraines or previous episodes like this.    Pt also reports feeling anxious intermittently, with some associated chest tightness. Attributes this to her untreated post-partum depression s/p c-section 2014 after which she "never returned to normal". Still feeling down, sad, and less interest in her normal activities. Denies SI/HI, worthlessness. Never sought treatment after delivering her daughter 4 years ago.   Depression screen PHQ 2/9 02/24/2017  Decreased Interest 1  Down, Depressed, Hopeless 2  PHQ - 2 Score 3  Altered sleeping 2  Tired, decreased energy 2  Change in appetite 1  Feeling bad or failure about yourself  2  Trouble concentrating 2  Moving slowly or fidgety/restless 2  Suicidal thoughts 0  PHQ-9 Score 14   GAD 7 : Generalized Anxiety Score 02/24/2017  Nervous, Anxious, on Edge 3  Control/stop worrying 3  Worry too much - different things 3  Trouble relaxing 2  Restless 1  Easily annoyed or irritable 1  Afraid - awful might happen 1  Total GAD 7 Score 14  Anxiety Difficulty Somewhat difficult     Social History   Social History  . Marital status:  Married    Spouse name: N/A  . Number of children: 2  . Years of education: BS   Occupational History  . [employer]    Social History Main Topics  . Smoking status: Never Smoker  . Smokeless tobacco: Never Used  . Alcohol use Yes     Comment: 1 drink per week  . Drug use: No  . Sexual activity: Yes    Partners: Male    Birth control/ protection: None     Comment: Married   Other Topics Concern  . Not on file   Social History Narrative   Lives at home w/ her husband and 2 children   Right-handed   Caffeine: 1 soda per day    Relevant past medical, surgical, family and social history reviewed and updated as indicated. Interim medical history since our last visit reviewed. Allergies and medications reviewed and updated.  Review of Systems  Constitutional: Negative for chills, diaphoresis, fatigue, fever and unexpected weight change.  HENT: Negative.   Eyes: Negative for photophobia and visual disturbance.  Respiratory: Positive for chest tightness.   Cardiovascular: Negative.   Gastrointestinal: Negative.   Endocrine: Negative.   Genitourinary: Negative.   Musculoskeletal: Negative.   Skin: Negative.   Allergic/Immunologic: Negative.   Neurological: Positive for light-headedness and headaches. Negative for dizziness, seizures, syncope,  weakness and numbness.  Hematological: Negative.   Psychiatric/Behavioral: Negative for self-injury and suicidal ideas. The patient is nervous/anxious.    Per HPI unless specifically indicated above     Objective:    BP 115/80   Pulse 75   Temp 98.5 F (36.9 C)   Wt 197 lb (89.4 kg)   LMP 02/11/2017 (Approximate)   SpO2 98%   BMI 36.03 kg/m   Wt Readings from Last 3 Encounters:  02/24/17 197 lb (89.4 kg)  01/20/17 201 lb (91.2 kg)  09/10/16 198 lb 8 oz (90 kg)    Physical Exam  Constitutional: She is oriented to person, place, and time. She appears well-developed and well-nourished. No distress.    HENT:  Head: Normocephalic and atraumatic.  Right Ear: Tympanic membrane and external ear normal.  Left Ear: Tympanic membrane and external ear normal.  Nose: Nose normal.  Mouth/Throat: Oropharynx is clear and moist. No oropharyngeal exudate.  Eyes: Conjunctivae are normal. Pupils are equal, round, and reactive to light. Right eye exhibits no discharge. Left eye exhibits no discharge. No scleral icterus.  Neck: Normal range of motion. Neck supple.  Cardiovascular: Normal rate and regular rhythm.  Exam reveals no gallop and no friction rub.   No murmur heard. Pulmonary/Chest: Effort normal and breath sounds normal. No respiratory distress. She has no wheezes. She has no rales. She exhibits no tenderness.  Lymphadenopathy:    She has no cervical adenopathy.  Neurological: She is alert and oriented to person, place, and time. She has normal strength. No sensory deficit.  Skin: Skin is warm and dry. She is not diaphoretic.  Psychiatric: She has a normal mood and affect. Her behavior is normal. Judgment and thought content normal.      Assessment & Plan:   Problem List Items Addressed This Visit      Other   Depression, recurrent (HCC)    Initial PHQ 9 score of 14, discussed multiple options for management and patient wanting to try Cymbalta. Will start with 20 mg QD and increase after 1 week to BID. F/u in 6 weeks for Physical Exam and medication f/u. Discussed risks and benefits, possible side effects, expectations for timeframe of effect.       Relevant Medications   DULoxetine (CYMBALTA) 20 MG capsule    Other Visit Diagnoses    Tension-type headache, not intractable, unspecified chronicity pattern    -  Primary   Continue tylenol prn, excedrin if needed. Discussed essential oils to help with relaxation and tension relief.    Relevant Medications   DULoxetine (CYMBALTA) 20 MG capsule       Follow up plan: Return in about 4 weeks (around 03/24/2017) for Physical  Exam.

## 2017-02-25 NOTE — Telephone Encounter (Signed)
Called and LVM for pt to call and set up f/u with NP per CW,MD request. Advised office closed tomorrow. We are getting upgrade to phone system. She can call Monday during regular business hours to schedule.

## 2017-02-25 NOTE — Telephone Encounter (Signed)
Called and LVM for pt again relaying Dr Anne Hahn message. Asked her to call back and set up appt with NP as requested by CW,MD for medication adjustments. Gave [facility] phone number.

## 2017-03-03 NOTE — Telephone Encounter (Signed)
Called and spoke with patient. Scheduled appt for 03/12/17 at 830am with CM,NP. Patient intially could not take appt because she has to take son to school. Offered several other appt later on, but patient declined (had doctor appt or other conflicts). I advised her to call back if she cannot work out something for 03/12/17 to come to appt.   She had received prior messages but has not been feeling well and has not been able to call us back.

## 2017-03-12 ENCOUNTER — Ambulatory Visit: Payer: Self-pay | Admitting: Nurse Practitioner

## 2017-03-23 ENCOUNTER — Encounter: Payer: Self-pay | Admitting: Family Medicine

## 2017-03-24 ENCOUNTER — Ambulatory Visit: Payer: Medicaid Other | Admitting: Obstetrics and Gynecology

## 2017-03-24 ENCOUNTER — Ambulatory Visit: Payer: Medicaid Other | Admitting: Family Medicine

## 2017-03-26 ENCOUNTER — Encounter: Payer: Self-pay | Admitting: Family Medicine

## 2017-03-26 ENCOUNTER — Ambulatory Visit (INDEPENDENT_AMBULATORY_CARE_PROVIDER_SITE_OTHER): Payer: Medicaid Other | Admitting: Family Medicine

## 2017-03-26 VITALS — BP 120/74 | HR 66 | Wt 194.8 lb

## 2017-03-26 DIAGNOSIS — Z3201 Encounter for pregnancy test, result positive: Secondary | ICD-10-CM | POA: Diagnosis not present

## 2017-03-26 DIAGNOSIS — F339 Major depressive disorder, recurrent, unspecified: Secondary | ICD-10-CM | POA: Diagnosis not present

## 2017-03-26 DIAGNOSIS — R569 Unspecified convulsions: Secondary | ICD-10-CM | POA: Diagnosis not present

## 2017-03-26 LAB — PREGNANCY, URINE: PREG TEST UR: POSITIVE — AB

## 2017-03-26 NOTE — Progress Notes (Signed)
BP 120/74   Pulse 66   Wt 194 lb 12.8 oz (88.4 kg)   LMP 03/15/2017   SpO2 99%   BMI 35.63 kg/m    Subjective:    Patient ID: Tamara Roughenavira Legrande, female    DOB: 1986/08/27, 31 y.o.   MRN: [id number]  HPI: Tamara Tate is a 31 y.o. female  Chief Complaint  Patient presents with  . Possible Pregnancy    May 7th first missed period   Patient presents today for missed period and + home pregnancy test. LMP April 6th. Recently started on cymbalta for depression and HAs with good relief. Followed by Neurology for seizures and is stable on lamictal. Taking prenatal vitamins and watching her diet carefully since finding out. Having some nausea and fatigue, but denies vomiting, cramping, or spotting/bleeding. Controlling sxs with lots of rest and ginger ale.   Past Medical History:  Diagnosis Date  . Anxiety   . Diabetes mellitus without complication (HCC)   . Hyperlipidemia   . Seasonal allergies   . Seizure [facility](HCC)    Social History   Social History  . Marital status: Married    Spouse name: N/A  . Number of children: 2  . Years of education: BS   Occupational History  . [employer]    Social History Main Topics  . Smoking status: Never Smoker  . Smokeless tobacco: Never Used  . Alcohol use Yes     Comment: 1 drink per week  . Drug use: No  . Sexual activity: Yes    Partners: Male    Birth control/ protection: None     Comment: Married   Other Topics Concern  . Not on file   Social History Narrative   Lives at home w/ her husband and 2 children   Right-handed   Caffeine: 1 soda per day    Relevant past medical, surgical, family and social history reviewed and updated as indicated. Interim medical history since our last visit reviewed. Allergies and medications reviewed and updated.  Review of Systems  Constitutional: Positive for fatigue.  HENT: Negative.   Respiratory: Negative.   Cardiovascular: Negative.   Gastrointestinal: Positive for  nausea.  Genitourinary: Negative.   Musculoskeletal: Negative.   Skin: Negative.   Neurological: Negative.   Psychiatric/Behavioral: Negative.    Per HPI unless specifically indicated above     Objective:    BP 120/74   Pulse 66   Wt 194 lb 12.8 oz (88.4 kg)   LMP 03/15/2017   SpO2 99%   BMI 35.63 kg/m   Wt Readings from Last 3 Encounters:  03/26/17 194 lb 12.8 oz (88.4 kg)  02/24/17 197 lb (89.4 kg)  01/20/17 201 lb (91.2 kg)    Physical Exam  Constitutional: She is oriented to person, place, and time. She appears well-developed and well-nourished. No distress.  HENT:  Head: Atraumatic.  Eyes: Conjunctivae are normal. Pupils are equal, round, and reactive to light.  Neck: Normal range of motion. Neck supple.  Cardiovascular: Normal rate and normal heart sounds.   Pulmonary/Chest: Effort normal and breath sounds normal. No respiratory distress.  Abdominal: Soft. Bowel sounds are normal. There is no tenderness.  Musculoskeletal: Normal range of motion.  Neurological: She is alert and oriented to person, place, and time.  Skin: Skin is warm and dry.  Psychiatric: She has a normal mood and affect. Her behavior is normal.  Nursing note and vitals reviewed.     Assessment & Plan:  Problem List Items Addressed This Visit      Other   Convulsions/seizures (HCC)    Advised pt to call her Neurologist ASAP for advice regarding pregnancy recommendations with regard to her seizure medications. Pt agreeable and will call today. Discussed taking extra folic acid supplement in addition to her prenatal vitamin while on the lamictal.       Depression, recurrent (HCC)    Pt electing to taper off cymbalta, discussed slow taper down. F/u if any issues coming off the medication. Discussed some safer alternatives in case needed, but pt hoping to be able to be off any depression medications while pregnant.        Other Visit Diagnoses    Positive pregnancy test    -  Primary    Established with [facility], will call today to set up prenatal care appt. Discussed continuing prenatal vitamins, healthy diet, and regular exercise.    Relevant Orders   Pregnancy, urine (Completed)       Follow up plan: Return if symptoms worsen or fail to improve.

## 2017-03-26 NOTE — Assessment & Plan Note (Addendum)
Advised pt to call her Neurologist ASAP for advice regarding pregnancy recommendations with regard to her seizure medications. Pt agreeable and will call today. Discussed taking extra folic acid supplement in addition to her prenatal vitamin while on the lamictal.

## 2017-03-26 NOTE — Assessment & Plan Note (Signed)
Pt electing to taper off cymbalta, discussed slow taper down. F/u if any issues coming off the medication. Discussed some safer alternatives in case needed, but pt hoping to be able to be off any depression medications while pregnant.

## 2017-03-29 ENCOUNTER — Encounter: Payer: Self-pay | Admitting: Family Medicine

## 2017-03-29 NOTE — Patient Instructions (Signed)
Follow up as needed

## 2017-03-30 ENCOUNTER — Ambulatory Visit: Payer: Self-pay | Admitting: Nurse Practitioner

## 2017-04-12 ENCOUNTER — Ambulatory Visit: Payer: Self-pay | Admitting: Family Medicine

## 2017-04-15 ENCOUNTER — Encounter: Payer: Self-pay | Admitting: Obstetrics and Gynecology

## 2017-04-15 ENCOUNTER — Ambulatory Visit (INDEPENDENT_AMBULATORY_CARE_PROVIDER_SITE_OTHER): Payer: Medicaid Other | Admitting: Obstetrics and Gynecology

## 2017-04-15 VITALS — BP 116/70 | Wt 198.0 lb

## 2017-04-15 DIAGNOSIS — O24919 Unspecified diabetes mellitus in pregnancy, unspecified trimester: Secondary | ICD-10-CM | POA: Insufficient documentation

## 2017-04-15 DIAGNOSIS — Z3480 Encounter for supervision of other normal pregnancy, unspecified trimester: Secondary | ICD-10-CM

## 2017-04-15 DIAGNOSIS — Z3A09 9 weeks gestation of pregnancy: Secondary | ICD-10-CM

## 2017-04-15 DIAGNOSIS — O0990 Supervision of high risk pregnancy, unspecified, unspecified trimester: Secondary | ICD-10-CM

## 2017-04-15 DIAGNOSIS — O34219 Maternal care for unspecified type scar from previous cesarean delivery: Secondary | ICD-10-CM | POA: Insufficient documentation

## 2017-04-15 DIAGNOSIS — O99210 Obesity complicating pregnancy, unspecified trimester: Secondary | ICD-10-CM

## 2017-04-15 DIAGNOSIS — O24119 Pre-existing diabetes mellitus, type 2, in pregnancy, unspecified trimester: Secondary | ICD-10-CM

## 2017-04-15 HISTORY — DX: Unspecified diabetes mellitus in pregnancy, unspecified trimester: O24.919

## 2017-04-15 HISTORY — DX: Supervision of high risk pregnancy, unspecified, unspecified trimester: O09.90

## 2017-04-15 NOTE — Progress Notes (Signed)
NOB/concerned since she has diabetes

## 2017-04-15 NOTE — Progress Notes (Signed)
New Obstetric Patient H&P    Chief Complaint: "Desires prenatal care"   History of Present Illness: Patient is a 31 y.o. [id number] Not Hispanic or Latino female, LMP 02/11/2017 presents with amenorrhea and positive home pregnancy test. Based on her  LMP, her EDD is 11/18/2017. and her EGA is [redacted]w[redacted]d. Cycles are regular monthly lasting 5-6 days. Her last pap smear was 12/02/2015 and was NIL HPV negative.    She had a urine pregnancy test which was positive 3 week(s)  ago. HSince her LMP she claims she has experienced some fatigue, mild nausea, breast tendernes. She denies vaginal bleeding. Her past medical history is contibutory for DMII, seizure disorder (last seizure October 2017, stopped Lamictal in February). Her prior pregnancies are notable for complicated by DMII, macoromia, two prior cesarean section for fetal distress  Since her LMP, she admits to the use of tobacco products  no She claims she has gained   no pounds since the start of her pregnancy.  There are cats in the home in the home  no  She admits close contact with children on a regular basis  yes  She has had chicken pox in the past yes She has had Tuberculosis exposures, symptoms, or previously tested positive for TB   no Current or past history of domestic violence. no  Genetic Screening/Teratology Counseling: (Includes patient, baby's father, or anyone in either family with:)   1. Patient's age >/= 28 at [facility]  no 2. Thalassemia ([place], [place], Mediterranean, or Asian background): MCV<80  no 3. Neural tube defect (meningomyelocele, spina bifida, anencephaly)  no 4. Congenital heart defect  no  5. Down syndrome  no 6. Tay-Sachs (Jewish, [place]  no 7. Canavan's Disease  no 8. Sickle cell disease or trait (African)  no  9. Hemophilia or other blood disorders  no  10. Muscular dystrophy  no  11. Cystic fibrosis  no  12. Huntington's Chorea  no  13. Mental retardation/autism  no 14. Other inherited genetic  or chromosomal disorder  no 15. Maternal metabolic disorder (DM, PKU, etc)  no 16. Patient or FOB with a child with a birth defect not listed above Yes 16a. Patient or FOB with a birth defect themselves no 17. Recurrent pregnancy loss, or stillbirth  no  18. Any medications since LMP other than prenatal vitamins (include vitamins, supplements, OTC meds, drugs, alcohol)  no 19. Any other genetic/environmental exposure to discuss  no  Infection History:   1. Lives with someone with TB or TB exposed  no  2. Patient or partner has history of genital herpes  no 3. Rash or viral illness since LMP  no 4. History of STI (GC, CT, HPV, syphilis, HIV)  no 5. History of recent travel :  no  Other pertinent information:  yes both of her prior pregnancies had some cardiac issues    Review of Systems: 10 point review of systems negative unless otherwise noted in HPI  Past Medical History:  Past Medical History:  Diagnosis Date  . Anxiety   . Diabetes mellitus without complication (HCC)   . Hyperlipidemia   . Seasonal allergies   . Seizure [facility])     Past Surgical History:  Past Surgical History:  Procedure Laterality Date  . CESAREAN SECTION     x 2    Gynecologic History: Patient's last menstrual period was 02/11/2017 (approximate).  Obstetric History: [id number]  Family History:  Family History  Problem Relation Age of  Onset  . Diabetes Mother   . Hypertension Mother   . Asthma Father   . Diabetes Maternal Grandmother   . Cancer Maternal Grandfather        Leukemia  . Cancer Paternal Grandfather        Prostate  . Breast cancer Maternal Aunt     Social History:  Social History   Social History  . Marital status: Married    Spouse name: N/A  . Number of children: 2  . Years of education: BS   Occupational History  . [employer]    Social History Main Topics  . Smoking status: Never Smoker  . Smokeless tobacco: Never Used  . Alcohol use Yes       Comment: 1 drink per week  . Drug use: No  . Sexual activity: Yes    Partners: Male    Birth control/ protection: None     Comment: Married   Other Topics Concern  . Not on file   Social History Narrative   Lives at home w/ her husband and 2 children   Right-handed   Caffeine: 1 soda per day    Allergies:  Allergies  Allergen Reactions  . Keppra [Levetiracetam]     Suicidal ideation  . Other Hives    NUTS    Medications: Prior to Admission medications   Medication Sig Start Date End Date Taking? Authorizing Provider  metFORMIN (GLUCOPHAGE XR) 500 MG 24 hr tablet Take 1 tablet (500 mg total) by mouth daily with breakfast. 08/26/16  Yes Particia Nearing, PA-C    Physical Exam Vitals: Blood pressure 116/70, weight 198 lb (89.8 kg), last menstrual period 02/11/2017, unknown if currently breastfeeding. . General: NAD HEENT: normocephalic, anicteric Thyroid: no enlargement, no palpable nodules Pulmonary: No increased work of breathing, CTAB Cardiovascular: RRR, distal pulses 2+ Abdomen: NABS, soft, non-tender, non-distended.  Umbilicus without lesions.  No hepatomegaly, splenomegaly or masses palpable. No evidence of hernia  Genitourinary:  External: Normal external female genitalia.  Normal urethral meatus, normal  Bartholin's and Skene's glands.    Vagina: Normal vaginal mucosa, no evidence of prolapse.    Cervix: Grossly normal in appearance, no bleeding  Uterus:  Non-enlarged, mobile, normal contour.  No CMT  Adnexa: ovaries non-enlarged, no adnexal masses  Rectal: deferred Extremities: no edema, erythema, or tenderness Neurologic: Grossly intact Psychiatric: mood appropriate, affect full   Assessment: 31 y.o. [id number] at [redacted]w[redacted]d by LMP presenting to initiate care, pregnancy complicated by DMII, obesity, seizure disorder, and history of two prior Cesarean sections  Plan: 1) Avoid alcoholic beverages. 2) Patient encouraged not to smoke.  3) Discontinue the  use of all non-medicinal drugs and chemicals.  4) Take prenatal vitamins daily.  5) Nutrition, food safety (fish, cheese advisories, and high nitrite foods) and exercise discussed. 6) Hospital and practice style discussed with cross coverage system.  7) Genetic Screening, such as with 1st Trimester Screening, cell free fetal DNA, AFP testing, and Ultrasound, as well as with amniocentesis and CVS as appropriate, is discussed with patient. At the conclusion of today's visit patient requested genetic testing 8) Patient is asked about travel to areas at risk for the Zika virus, and counseled to avoid travel and exposure to mosquitoes or sexual partners who may have themselves been exposed to the virus. Testing is discussed, and will be ordered as appropriate.  9) DM II - Prior records reviewed patient was on lantus 90U daily qhs and humalog 32/34/32 units three time  daily with meals in third trimester last pregnancy - Discussed ADA diet, in addition for obese patient diagnosed prior to 20 weeks recommend caloric restriction to 25 Kcal/kg/day ideal body weight.  Carbohydrates should be limited to 33-40% of daily caloric intake with attention given to high glycemic index vs low glycemic index foods.  The remaining calories should be split approximately 20% protein, 40% fats.  Three meals a day with two snacks to spread carbohdyrate load was recommended. - Home glucose monitoring 4 times a day (AM fasting goal <100, 2-hr postprandial <120).  Importance in maintaining glucose log discussed as proper management will rely on assessing home BG readings.  Stressed team approach with patient being a vital member of this team in ensuring best possible outcome for her fetus. - If good control every other week follow up appopriate - Antepartum testing indicated at 32-34 weeks if on meds for glycemic control, monthly growth scan starting at 28 weeks.  I anticipate poor glycemia control as in prior pregnancy and likely  will require twice weekly testing - Evidence of fetal macrosomia or poor control may necessitate earlier delivery, 39 weeks or less.  If fetal weight >4500g counseling regarding risk of shoulder dystocia and consideration of C-section discussed. - If pharmacotherapy should need to be started insulin remains the ADA recommended treatment.  Metformin may be used but this indication is not FDA approved, it readily crosses the placenta and while 2-year follow up data have not shown significant developmental differences long term follow up data is unavailable.  Glyburide may be used as a second line oral agent but is not superior to insulin in preventing macrosomia, may be associated with increased rates of neonatal hypyoglycemia, and is not FDA approved for this indication.  Discussed that I anticipate she will require insulin as in prior pregnancies.  - Stressed that diabetes is a common complicating factor in pregnancy affecting about 7% of pregnancies - Managed properly and with patient adherence to monitoring, diet and lifestyle changes does not have to be associated with adverse pregnancy outcome.  However, uncontrolled may lead to adverse outcomes including still birth, shoulder dystocia, need for cesarean delivery. - Increased risk of preeclampsia in patient diagnosed with gestational diabetes or pre-existing diabetes discsued  - Obtained baseline CMP, HgbA1C, P/C ratio - Needs fetal echocardiogram 20-23 weeks  ACOG Practive Bulletin 190 "Gestatioal Diabetes" Updated Februrary 2018  10) Start low dose ASA at >[redacted] weeks gestation as per USPTF recommendation "Low-Dose Aspirin Use for the Prevention of Morbidity and Mortality From Preeclampsia: Preventive Medicine"  furthermore endorsed by ACOG, WHO, and NIH based on evidence level B for the prevention of preeclampsia  In women deemed high risk  (diabetes, renal disease, chronic hypertension, history of preeclampsia in prior gestation, autoimmune  diseases, or multifetal gestations)  11) Seizure disorder- discontinued antiepileptic 2 months prior  12) [facility] will discuss with remainder of [facility] providers as to willingness to follow this patient given she will need to be on insulin.  Given that she is a repeat Cesarean section will not require insulin gtt intrapartum

## 2017-04-15 NOTE — Patient Instructions (Signed)
First Trimester of Pregnancy The first trimester of pregnancy is from week 1 until the end of week 13 (months 1 through 3). A week after a sperm fertilizes an egg, the egg will implant on the wall of the uterus. This embryo will begin to develop into a baby. Genes from you and your partner will form the baby. The female genes will determine whether the baby will be a boy or a girl. At 6-8 weeks, the eyes and face will be formed, and the heartbeat can be seen on ultrasound. At the end of 12 weeks, all the baby's organs will be formed. Now that you are pregnant, you will want to do everything you can to have a healthy baby. Two of the most important things are to get good prenatal care and to follow your health care provider's instructions. Prenatal care is all the medical care you receive before the baby's birth. This care will help prevent, find, and treat any problems during the pregnancy and childbirth. Body changes during your first trimester Your body goes through many changes during pregnancy. The changes vary from woman to woman.  You may gain or lose a couple of pounds at first.  You may feel sick to your stomach (nauseous) and you may throw up (vomit). If the vomiting is uncontrollable, call your health care provider.  You may tire easily.  You may develop headaches that can be relieved by medicines. All medicines should be approved by your health care provider.  You may urinate more often. Painful urination may mean you have a bladder infection.  You may develop heartburn as a result of your pregnancy.  You may develop constipation because certain hormones are causing the muscles that push stool through your intestines to slow down.  You may develop hemorrhoids or swollen veins (varicose veins).  Your breasts may begin to grow larger and become tender. Your nipples may stick out more, and the tissue that surrounds them (areola) may become darker.  Your gums may bleed and may be  sensitive to brushing and flossing.  Dark spots or blotches (chloasma, mask of pregnancy) may develop on your face. This will likely fade after the baby is born.  Your menstrual periods will stop.  You may have a loss of appetite.  You may develop cravings for certain kinds of food.  You may have changes in your emotions from day to day, such as being excited to be pregnant or being concerned that something may go wrong with the pregnancy and baby.  You may have more vivid and strange dreams.  You may have changes in your hair. These can include thickening of your hair, rapid growth, and changes in texture. Some women also have hair loss during or after pregnancy, or hair that feels dry or thin. Your hair will most likely return to normal after your baby is born.  What to expect at prenatal visits During a routine prenatal visit:  You will be weighed to make sure you and the baby are growing normally.  Your blood pressure will be taken.  Your abdomen will be measured to track your baby's growth.  The fetal heartbeat will be listened to between weeks 10 and 14 of your pregnancy.  Test results from any previous visits will be discussed.  Your health care provider may ask you:  How you are feeling.  If you are feeling the baby move.  If you have had any abnormal symptoms, such as leaking fluid, bleeding, severe headaches,   or abdominal cramping.  If you are using any tobacco products, including cigarettes, chewing tobacco, and electronic cigarettes.  If you have any questions.  Other tests that may be performed during your first trimester include:  Blood tests to find your blood type and to check for the presence of any previous infections. The tests will also be used to check for low iron levels (anemia) and protein on red blood cells (Rh antibodies). Depending on your risk factors, or if you previously had diabetes during pregnancy, you may have tests to check for high blood  sugar that affects pregnant women (gestational diabetes).  Urine tests to check for infections, diabetes, or protein in the urine.  An ultrasound to confirm the proper growth and development of the baby.  Fetal screens for spinal cord problems (spina bifida) and Down syndrome.  HIV (human immunodeficiency virus) testing. Routine prenatal testing includes screening for HIV, unless you choose not to have this test.  You may need other tests to make sure you and the baby are doing well.  Follow these instructions at home: Medicines  Follow your health care provider's instructions regarding medicine use. Specific medicines may be either safe or unsafe to take during pregnancy.  Take a prenatal vitamin that contains at least 600 micrograms (mcg) of folic acid.  If you develop constipation, try taking a stool softener if your health care provider approves. Eating and drinking  Eat a balanced diet that includes fresh fruits and vegetables, whole grains, good sources of protein such as meat, eggs, or tofu, and low-fat dairy. Your health care provider will help you determine the amount of weight gain that is right for you.  Avoid raw meat and uncooked cheese. These carry germs that can cause birth defects in the baby.  Eating four or five small meals rather than three large meals a day may help relieve nausea and vomiting. If you start to feel nauseous, eating a few soda crackers can be helpful. Drinking liquids between meals, instead of during meals, also seems to help ease nausea and vomiting.  Limit foods that are high in fat and processed sugars, such as fried and sweet foods.  To prevent constipation: ? Eat foods that are high in fiber, such as fresh fruits and vegetables, whole grains, and beans. ? Drink enough fluid to keep your urine clear or pale yellow. Activity  Exercise only as directed by your health care provider. Most women can continue their usual exercise routine during  pregnancy. Try to exercise for 30 minutes at least 5 days a week. Exercising will help you: ? Control your weight. ? Stay in shape. ? Be prepared for labor and delivery.  Experiencing pain or cramping in the lower abdomen or lower back is a good sign that you should stop exercising. Check with your health care provider before continuing with normal exercises.  Try to avoid standing for long periods of time. Move your legs often if you must stand in one place for a long time.  Avoid heavy lifting.  Wear low-heeled shoes and practice good posture.  You may continue to have sex unless your health care provider tells you not to. Relieving pain and discomfort  Wear a good support bra to relieve breast tenderness.  Take warm sitz baths to soothe any pain or discomfort caused by hemorrhoids. Use hemorrhoid cream if your health care provider approves.  Rest with your legs elevated if you have leg cramps or low back pain.  If you develop   varicose veins in your legs, wear support hose. Elevate your feet for 15 minutes, 3-4 times a day. Limit salt in your diet. Prenatal care  Schedule your prenatal visits by the twelfth week of pregnancy. They are usually scheduled monthly at first, then more often in the last 2 months before delivery.  Write down your questions. Take them to your prenatal visits.  Keep all your prenatal visits as told by your health care provider. This is important. Safety  Wear your seat belt at all times when driving.  Make a list of emergency phone numbers, including numbers for family, friends, the hospital, and police and fire departments. General instructions  Ask your health care provider for a referral to a local prenatal education class. Begin classes no later than the beginning of month 6 of your pregnancy.  Ask for help if you have counseling or nutritional needs during pregnancy. Your health care provider can offer advice or refer you to specialists for help  with various needs.  Do not use hot tubs, steam rooms, or saunas.  Do not douche or use tampons or scented sanitary pads.  Do not cross your legs for long periods of time.  Avoid cat litter boxes and soil used by cats. These carry germs that can cause birth defects in the baby and possibly loss of the fetus by miscarriage or stillbirth.  Avoid all smoking, herbs, alcohol, and medicines not prescribed by your health care provider. Chemicals in these products affect the formation and growth of the baby.  Do not use any products that contain nicotine or tobacco, such as cigarettes and e-cigarettes. If you need help quitting, ask your health care provider. You may receive counseling support and other resources to help you quit.  Schedule a dentist appointment. At home, brush your teeth with a soft toothbrush and be gentle when you floss. Contact a health care provider if:  You have dizziness.  You have mild pelvic cramps, pelvic pressure, or nagging pain in the abdominal area.  You have persistent nausea, vomiting, or diarrhea.  You have a bad smelling vaginal discharge.  You have pain when you urinate.  You notice increased swelling in your face, hands, legs, or ankles.  You are exposed to fifth disease or chickenpox.  You are exposed to German measles (rubella) and have never had it. Get help right away if:  You have a fever.  You are leaking fluid from your vagina.  You have spotting or bleeding from your vagina.  You have severe abdominal cramping or pain.  You have rapid weight gain or loss.  You vomit blood or material that looks like coffee grounds.  You develop a severe headache.  You have shortness of breath.  You have any kind of trauma, such as from a fall or a car accident. Summary  The first trimester of pregnancy is from week 1 until the end of week 13 (months 1 through 3).  Your body goes through many changes during pregnancy. The changes vary from  woman to woman.  You will have routine prenatal visits. During those visits, your health care provider will examine you, discuss any test results you may have, and talk with you about how you are feeling. This information is not intended to replace advice given to you by your health care provider. Make sure you discuss any questions you have with your health care provider. Document Released: 10/20/2001 Document Revised: 10/07/2016 Document Reviewed: 10/07/2016 Elsevier Interactive Patient Education  2017 Elsevier   Inc.  

## 2017-04-16 ENCOUNTER — Encounter: Payer: Self-pay | Admitting: Obstetrics and Gynecology

## 2017-04-16 ENCOUNTER — Other Ambulatory Visit: Payer: Self-pay | Admitting: Obstetrics and Gynecology

## 2017-04-16 DIAGNOSIS — O99210 Obesity complicating pregnancy, unspecified trimester: Secondary | ICD-10-CM

## 2017-04-16 DIAGNOSIS — O24119 Pre-existing diabetes mellitus, type 2, in pregnancy, unspecified trimester: Secondary | ICD-10-CM

## 2017-04-16 HISTORY — DX: Obesity complicating pregnancy, unspecified trimester: O99.210

## 2017-04-16 LAB — PROTEIN / CREATININE RATIO, URINE
Creatinine, Urine: 88.2 mg/dL
PROTEIN/CREAT RATIO: 236 mg/g{creat} — AB (ref 0–200)
Protein, Ur: 20.8 mg/dL

## 2017-04-16 LAB — HEMOGLOBIN A1C
ESTIMATED AVERAGE GLUCOSE: 378 mg/dL
HEMOGLOBIN A1C: 14.8 % — AB (ref 4.8–5.6)

## 2017-04-16 MED ORDER — INSULIN DETEMIR 100 UNIT/ML FLEXPEN: PEN_INJECTOR | SUBCUTANEOUS | 11 refills | Status: DC

## 2017-04-16 MED ORDER — INSULIN PEN NEEDLE 32G X 6 MM MISC: 3 refills | Status: DC

## 2017-04-16 MED ORDER — ACCU-CHEK FASTCLIX LANCETS MISC: 1.0000 [IU] | Freq: Four times a day (QID) | 12 refills | Status: AC

## 2017-04-16 MED ORDER — INSULIN ASPART 100 UNIT/ML FLEXPEN: PEN_INJECTOR | SUBCUTANEOUS | 11 refills | Status: DC

## 2017-04-16 MED ORDER — GLUCOSE BLOOD VI STRP: ORAL_STRIP | 12 refills | Status: AC

## 2017-04-16 MED ORDER — ACCU-CHEK NANO SMARTVIEW W/DEVICE KIT: 1.0000 | PACK | 0 refills | Status: AC

## 2017-04-17 LAB — URINE CULTURE

## 2017-04-17 LAB — GC/CHLAMYDIA PROBE AMP
Chlamydia trachomatis, NAA: NEGATIVE
Neisseria gonorrhoeae by PCR: NEGATIVE

## 2017-04-19 LAB — RPR+RH+ABO+RUB AB+AB SCR+CB...
ANTIBODY SCREEN: NEGATIVE
HIV Screen 4th Generation wRfx: NONREACTIVE
Hematocrit: 34.7 % (ref 34.0–46.6)
Hemoglobin: 10.6 g/dL — ABNORMAL LOW (ref 11.1–15.9)
Hepatitis B Surface Ag: NEGATIVE
MCH: 23.7 pg — AB (ref 26.6–33.0)
MCHC: 30.5 g/dL — AB (ref 31.5–35.7)
MCV: 78 fL — AB (ref 79–97)
Platelets: 387 10*3/uL — ABNORMAL HIGH (ref 150–379)
RBC: 4.47 x10E6/uL (ref 3.77–5.28)
RDW: 17 % — ABNORMAL HIGH (ref 12.3–15.4)
RH TYPE: POSITIVE
RPR Ser Ql: NONREACTIVE
VARICELLA: 724 {index} (ref >165)
WBC: 9.9 10*3/uL (ref 3.4–10.8)

## 2017-04-19 LAB — COMPREHENSIVE METABOLIC PANEL
A/G RATIO: 1 — AB (ref 1.2–2.2)
ALK PHOS: 59 IU/L (ref 39–117)
ALT: 9 IU/L (ref 0–32)
AST: 11 IU/L (ref 0–40)
Albumin: 3.7 g/dL (ref 3.5–5.5)
BUN/Creatinine Ratio: 19 (ref 9–23)
BUN: 12 mg/dL (ref 6–20)
CHLORIDE: 99 mmol/L (ref 96–106)
CO2: 19 mmol/L (ref 18–29)
Calcium: 9.5 mg/dL (ref 8.7–10.2)
Creatinine, Ser: 0.62 mg/dL (ref 0.57–1.00)
GFR calc non Af Amer: 120 mL/min/{1.73_m2} (ref >59)
GFR, EST AFRICAN AMERICAN: 138 mL/min/{1.73_m2} (ref >59)
GLUCOSE: 357 mg/dL — AB (ref 65–99)
Globulin, Total: 3.6 g/dL (ref 1.5–4.5)
POTASSIUM: 4.1 mmol/L (ref 3.5–5.2)
Sodium: 132 mmol/L — ABNORMAL LOW (ref 134–144)
Total Protein: 7.3 g/dL (ref 6.0–8.5)

## 2017-04-19 LAB — HEMOGLOBINOPATHY EVALUATION
HEMOGLOBIN F QUANTITATION: 0 % (ref 0.0–2.0)
HGB A: 98.3 % (ref 96.4–98.8)
HGB C: 0 %
HGB S: 0 %
HGB VARIANT: 0 %
Hemoglobin A2 Quantitation: 1.7 % — ABNORMAL LOW (ref 1.8–3.2)

## 2017-04-27 ENCOUNTER — Ambulatory Visit: Payer: Medicaid Other | Admitting: Obstetrics and Gynecology

## 2017-04-29 ENCOUNTER — Ambulatory Visit (INDEPENDENT_AMBULATORY_CARE_PROVIDER_SITE_OTHER): Payer: Medicaid Other

## 2017-04-29 ENCOUNTER — Ambulatory Visit (INDEPENDENT_AMBULATORY_CARE_PROVIDER_SITE_OTHER): Payer: Medicaid Other | Admitting: Obstetrics and Gynecology

## 2017-04-29 ENCOUNTER — Other Ambulatory Visit: Payer: Self-pay | Admitting: Obstetrics and Gynecology

## 2017-04-29 VITALS — BP 118/74 | Wt 198.0 lb

## 2017-04-29 DIAGNOSIS — O24119 Pre-existing diabetes mellitus, type 2, in pregnancy, unspecified trimester: Secondary | ICD-10-CM

## 2017-04-29 DIAGNOSIS — O0990 Supervision of high risk pregnancy, unspecified, unspecified trimester: Secondary | ICD-10-CM | POA: Diagnosis not present

## 2017-04-29 DIAGNOSIS — Z3A11 11 weeks gestation of pregnancy: Secondary | ICD-10-CM

## 2017-04-29 DIAGNOSIS — O34219 Maternal care for unspecified type scar from previous cesarean delivery: Secondary | ICD-10-CM

## 2017-04-29 DIAGNOSIS — Z3A09 9 weeks gestation of pregnancy: Secondary | ICD-10-CM | POA: Diagnosis not present

## 2017-04-29 DIAGNOSIS — Z3689 Encounter for other specified antenatal screening: Secondary | ICD-10-CM

## 2017-04-29 NOTE — Progress Notes (Signed)
U/s today. No vb. No lof.  

## 2017-04-29 NOTE — Progress Notes (Signed)
No VB, LOF. Nausea with pregnancy and taking PNVs. Try flintstones. S=D today on dating scan. [facility]. Pt's fasting sugars in high 100s/low 200s. Postprandial BS in 250s or higher. Using insulin. Making diet changes. Desires 1st tri scrn--RTO in 2 wks. No seizures.

## 2017-05-06 ENCOUNTER — Ambulatory Visit
Admission: RE | Admit: 2017-05-06 | Discharge: 2017-05-06 | Disposition: A | Discharge status: Home / Self Care | Payer: Medicaid Other | Source: Ambulatory Visit | Attending: Obstetrics and Gynecology | Admitting: Obstetrics and Gynecology

## 2017-05-06 VITALS — BP 129/73 | HR 74 | Temp 98.2°F | Resp 18 | Ht 62.4 in | Wt 196.8 lb

## 2017-05-06 DIAGNOSIS — Z309 Encounter for contraceptive management, unspecified: Secondary | ICD-10-CM

## 2017-05-06 DIAGNOSIS — E785 Hyperlipidemia, unspecified: Secondary | ICD-10-CM | POA: Diagnosis not present

## 2017-05-06 DIAGNOSIS — Z3A12 12 weeks gestation of pregnancy: Secondary | ICD-10-CM | POA: Insufficient documentation

## 2017-05-06 DIAGNOSIS — O24111 Pre-existing diabetes mellitus, type 2, in pregnancy, first trimester: Secondary | ICD-10-CM | POA: Diagnosis not present

## 2017-05-06 DIAGNOSIS — Z6835 Body mass index (BMI) 35.0-35.9, adult: Secondary | ICD-10-CM | POA: Diagnosis not present

## 2017-05-06 DIAGNOSIS — R569 Unspecified convulsions: Secondary | ICD-10-CM

## 2017-05-06 DIAGNOSIS — E1165 Type 2 diabetes mellitus with hyperglycemia: Secondary | ICD-10-CM | POA: Diagnosis present

## 2017-05-06 DIAGNOSIS — Z7689 Persons encountering health services in other specified circumstances: Secondary | ICD-10-CM | POA: Insufficient documentation

## 2017-05-06 DIAGNOSIS — Z79899 Other long term (current) drug therapy: Secondary | ICD-10-CM | POA: Diagnosis not present

## 2017-05-06 DIAGNOSIS — O99210 Obesity complicating pregnancy, unspecified trimester: Secondary | ICD-10-CM

## 2017-05-06 DIAGNOSIS — F419 Anxiety disorder, unspecified: Secondary | ICD-10-CM | POA: Insufficient documentation

## 2017-05-06 DIAGNOSIS — O24911 Unspecified diabetes mellitus in pregnancy, first trimester: Secondary | ICD-10-CM | POA: Diagnosis present

## 2017-05-06 DIAGNOSIS — O99281 Endocrine, nutritional and metabolic diseases complicating pregnancy, first trimester: Secondary | ICD-10-CM | POA: Diagnosis not present

## 2017-05-06 DIAGNOSIS — O0990 Supervision of high risk pregnancy, unspecified, unspecified trimester: Secondary | ICD-10-CM

## 2017-05-06 DIAGNOSIS — Z3009 Encounter for other general counseling and advice on contraception: Secondary | ICD-10-CM

## 2017-05-06 DIAGNOSIS — Z794 Long term (current) use of insulin: Secondary | ICD-10-CM | POA: Diagnosis not present

## 2017-05-06 DIAGNOSIS — O99341 Other mental disorders complicating pregnancy, first trimester: Secondary | ICD-10-CM | POA: Insufficient documentation

## 2017-05-06 DIAGNOSIS — O99211 Obesity complicating pregnancy, first trimester: Secondary | ICD-10-CM | POA: Insufficient documentation

## 2017-05-06 DIAGNOSIS — O34219 Maternal care for unspecified type scar from previous cesarean delivery: Secondary | ICD-10-CM

## 2017-05-06 HISTORY — DX: Encounter for contraceptive management, unspecified: Z30.9

## 2017-05-06 LAB — GLUCOSE, CAPILLARY: Glucose-Capillary: 186 mg/dL — ABNORMAL HIGH (ref 65–99)

## 2017-05-06 NOTE — Progress Notes (Signed)
[place] Maternal-Fetal Medicine Consultation   Chief Complaint: Poorly controlled Type 2 diabetes in pregnancy   HPI: [name] is a 31 y.o. [id number] at 29w0dby 04/29/2017 [facility] .  She notes she was diagnosed with diabetes around 2010. She had been followed for pre diabetes before that . It was picked up due to her obesity and her family history ( her mom has it). [facility] required insulin [facility] and [facility]  .  Pt feels she has a good understanding of diabetes in pregnancy after the past 2 pregnancies complicated by diabetes though her Hgb a1c was in the 10-11 range on review of her [facility] records. Her most recent Hgb A1c was 14.8  On 6/7 /18. Pt is taking long acting insulin daily and short acting twice daily ( of note in her last pregnancy she was taking 70 lantus 30/30/30 at the end)  She notes a recent 30 lb weight loss just before she conceived   Past Medical History: Patient  has a past medical history of Anxiety; Diabetes mellitus without complication ([place]; Hyperlipidemia; Seasonal allergies; and Seizure ([place].  Past Surgical History: She  has a past surgical history that includes Cesarean section.  Obstetric History:  OB History    Gravida Para Term Preterm AB Living   4 2 2   1 2    SAB TAB Ectopic Multiple Live Births   1       2    2012 josiah 37 weeks cesarean fetal intolerance of labor 9 lbs , septal hypertrophy in NICU - resolved on f/u pumped and fed x 1 month  2014 Anyah  3850 g 37 weeks emergent repeat decel a t FDC ,septal hypertrophy in NICU  pumped and fed x 1 month   Gynecologic History:  Patient's last menstrual period was 02/11/2017 (approximate).    Medications:  Current Outpatient Prescriptions:  .  ACCU-CHEK FASTCLIX LANCETS MISC, 1 Units by Percutaneous route 4 (four) times  daily., Disp: 100 each, Rfl: 12 .  Blood Glucose Monitoring Suppl (ACCU-CHEK NANO SMARTVIEW) w/Device KIT, 1 kit by Subdermal route as directed. Check blood sugars for fasting, and two hours after breakfast, lunch and dinner (4 checks daily), Disp: 1 kit, Rfl: 0 .  glucose blood (ACCU-CHEK SMARTVIEW) test strip, Use as instructed to check blood sugars, Disp: 100 each, Rfl: 12 .  insulin aspart (NOVOLOG) 100 UNIT/ML FlexPen, 7 units SQ with breakfast, 7 units SQ with dinner, Disp: 15 mL, Rfl: 11 .  Insulin Detemir (LEVEMIR FLEXPEN) 100 UNIT/ML Pen, 16 units SQ before breakfast 8 units SQ before dinner, Disp: 15 mL, Rfl: 11 .  Insulin Pen Needle (NOVOFINE) 32G X 6 MM MISC, Use as directed, Disp: 100 each, Rfl: 3 .  metFORMIN (GLUCOPHAGE XR) 500 MG 24 hr tablet, Take 1 tablet (500 mg total) by mouth daily with breakfast. (Patient not taking: Reported on 05/06/2017), Disp: 90 tablet, Rfl: 0  Allergies: Patient is allergic to keppra [levetiracetam] and other.  Social History: Patient  reports that she has never smoked. She has never used smokeless tobacco. She reports that she drinks alcohol. She reports that she does not use drugs.  Family History: family history includes Asthma in her father; Breast cancer in her maternal aunt; Cancer in her maternal grandfather and paternal grandfather; Diabetes  in her maternal grandmother and mother; Hypertension in her mother.  Review of Systems A full 12 point review of systems was negative or as noted in the History of Present Illness.  Physical Exam: BP 129/73 (BP Location: Right Arm)   Pulse 74   Temp 98.2 F (36.8 C) (Oral)   Resp 18   Ht 5' 2.4" (1.585 m)   Wt 196 lb 12.8 oz (89.3 kg)   LMP 02/11/2017 (Approximate)   SpO2 99%   BMI 35.54 kg/m  Obese AAF  RBS 186   Asessement: 1. Maternal obesity, antepartum   2. History of cesarean section complicating pregnancy   3 Type 2 diabetes on insulin long acting at night and bid short acting with very  elevated Hgb A1c- at higher risk for birth defects   4 desires BTL    Plan: 1. [facility]- feels like she knows what to do she just has to do it 2 . Blood sugar form given to pt and requested she record at least 4 times daily  3. Likely will need aggressive increase in insulin and addition of metformin- pt has been on in the past  4. Will need detailed u/s and fetal echo with peds cardiology given high hgb A1c , also consider fetal echo near term given prior septal hypertrophy  5 Monthly growth scans in third trimester and twice weekly testing starting at 30-32 weeks  6 ERLTCS and BTL at 37 weeks (unlikely control will be adequate to delay further)  7 recommend aspirin 81 mg  8 needs opthalmology visit   Pt strongly desires to do as much care locally as she can.  RTC 1 week for insulin adjustment offer aspirin and consider adding  back metformin       Total time spent with the patient was 30 minutes with greater than 50% spent in counseling and coordination of care. We appreciate this interesting consult and will be happy to be involved in the ongoing care of Ms. Forgione in anyway her obstetricians desire.  Gatha Mayer MD [facility]

## 2017-05-13 ENCOUNTER — Ambulatory Visit: Payer: Managed Care, Other (non HMO)

## 2017-05-13 ENCOUNTER — Other Ambulatory Visit: Payer: Medicaid Other

## 2017-05-13 ENCOUNTER — Ambulatory Visit (INDEPENDENT_AMBULATORY_CARE_PROVIDER_SITE_OTHER): Payer: Medicaid Other | Admitting: Certified Nurse Midwife

## 2017-05-13 VITALS — BP 112/72 | Wt 197.0 lb

## 2017-05-13 DIAGNOSIS — O24019 Pre-existing diabetes mellitus, type 1, in pregnancy, unspecified trimester: Secondary | ICD-10-CM

## 2017-05-13 DIAGNOSIS — Z3A13 13 weeks gestation of pregnancy: Secondary | ICD-10-CM

## 2017-05-13 DIAGNOSIS — O0990 Supervision of high risk pregnancy, unspecified, unspecified trimester: Secondary | ICD-10-CM

## 2017-05-13 DIAGNOSIS — O0992 Supervision of high risk pregnancy, unspecified, second trimester: Secondary | ICD-10-CM

## 2017-05-13 DIAGNOSIS — Z1379 Encounter for other screening for genetic and chromosomal anomalies: Secondary | ICD-10-CM

## 2017-05-13 NOTE — Progress Notes (Signed)
Pt reports no problems. NT scan today.  

## 2017-05-13 NOTE — Progress Notes (Signed)
Seen by DP and insulin increased to 16 units in Am and 8 units long acting insulin in PM and 8 units after each meal of short acting insulin States fastings are still around 180 and 2hr pp FSBSs up to 250.  Hemoglobin A1C at time of NOB was 14% Both of her previous children had septal hypertrophy at birth and required NICU x 1 month [facility].  First trimester test today, NT=1.487mm. [facility]' Will schedule ROB in 4 weeks- in case transfer has not been completed. [facility]. Advised to begin a baby aspirin daily

## 2017-05-17 ENCOUNTER — Ambulatory Visit
Admission: RE | Admit: 2017-05-17 | Discharge: 2017-05-17 | Disposition: A | Discharge status: Home / Self Care | Payer: Medicaid Other | Source: Ambulatory Visit | Attending: Obstetrics and Gynecology | Admitting: Obstetrics and Gynecology

## 2017-05-17 VITALS — BP 111/65 | Temp 98.3°F | Resp 18 | Ht 62.4 in | Wt 199.6 lb

## 2017-05-17 DIAGNOSIS — O34219 Maternal care for unspecified type scar from previous cesarean delivery: Secondary | ICD-10-CM

## 2017-05-17 DIAGNOSIS — O24019 Pre-existing diabetes mellitus, type 1, in pregnancy, unspecified trimester: Secondary | ICD-10-CM | POA: Diagnosis not present

## 2017-05-17 DIAGNOSIS — Z3A11 11 weeks gestation of pregnancy: Secondary | ICD-10-CM | POA: Insufficient documentation

## 2017-05-17 DIAGNOSIS — Z794 Long term (current) use of insulin: Secondary | ICD-10-CM | POA: Insufficient documentation

## 2017-05-17 DIAGNOSIS — O24011 Pre-existing diabetes mellitus, type 1, in pregnancy, first trimester: Secondary | ICD-10-CM | POA: Insufficient documentation

## 2017-05-17 DIAGNOSIS — F339 Major depressive disorder, recurrent, unspecified: Secondary | ICD-10-CM

## 2017-05-17 DIAGNOSIS — F329 Major depressive disorder, single episode, unspecified: Secondary | ICD-10-CM | POA: Insufficient documentation

## 2017-05-17 DIAGNOSIS — O99341 Other mental disorders complicating pregnancy, first trimester: Secondary | ICD-10-CM | POA: Insufficient documentation

## 2017-05-17 DIAGNOSIS — O99210 Obesity complicating pregnancy, unspecified trimester: Secondary | ICD-10-CM

## 2017-05-17 DIAGNOSIS — Z3009 Encounter for other general counseling and advice on contraception: Secondary | ICD-10-CM

## 2017-05-17 DIAGNOSIS — Z6836 Body mass index (BMI) 36.0-36.9, adult: Secondary | ICD-10-CM | POA: Diagnosis not present

## 2017-05-17 DIAGNOSIS — O99211 Obesity complicating pregnancy, first trimester: Secondary | ICD-10-CM | POA: Insufficient documentation

## 2017-05-17 DIAGNOSIS — R569 Unspecified convulsions: Secondary | ICD-10-CM

## 2017-05-17 NOTE — Progress Notes (Signed)
[facility]aternal-[facility]etal [facility] Consultation   Chief Complaint: follow up of poorly controlled type 2  diabetes in pregnancy - see consult on 04/2817 for detailed history   HPI: [name] is a 31 y.o. [id number] at [redacted]w[redacted]d by 04/29/2017 scan done at [facility] at [redacted] weeks gestation who presents in consultation from  [facility] for insulin dependent Type 2 diabetes in pregnancy .  Pt states she has been working harder with her diet and was proud of how she limited herself on July 4.  Her blood sugars are between 180-215 , no lows though she acknowledges she can feel low at normal blood sugars  Currently on:  Levemir in divided doses ( as she had done prior to pregnancy )  16 am /8 pm novolog 8/8/8 before each meal   Since her last visit, she had her first trimester screen at [facility] and they decided she should get her care at [facility]    Past Medical History: Patient  has a past medical history of Anxiety; Diabetes mellitus without complication (HCC); Hyperlipidemia; Seasonal allergies; and Seizure (HCC).  Past Surgical History: She  has a past surgical history that includes Cesarean section.  Obstetric History:  OB History    Gravida Para Term Preterm AB Living   4 2 2   1 2    SAB TAB Ectopic Multiple Live Births   1       2     Gynecologic History:  Patient's last menstrual period was 02/11/2017 (approximate).    Medications: She @[facility] @  Allergies: Patient is allergic to keppra [levetiracetam]; other; and soy allergy.  Social History: Patient  reports that she has never smoked. She has never used smokeless tobacco. She reports that she drinks alcohol. She reports that she does not use drugs.  Family History: family history includes Asthma in her father; Breast cancer in her maternal aunt; Cancer in her maternal grandfather and paternal grandfather; Diabetes in her maternal grandmother and mother; Hypertension in her mother.  Review of Systems A full 12 point review of systems was negative  or as noted in the History of Present Illness.  Physical Exam: BP 111/65   Temp 98.3 F (36.8 C)   Resp 18   Ht 5' 2.4" (1.585 m)   Wt 199 lb 9.6 oz (90.5 kg)   LMP 02/11/2017 (Approximate)   SpO2 99%   BMI 36.04 kg/m  General appearance - alert, well appearing, and in no distress Informal u/s SLIUP FHR 140  Asessement: 1. Encounter for other general counseling or advice on contraception   2. Maternal obesity, antepartum   3. History of cesarean section complicating pregnancy   4. Pre-existing type 1 diabetes mellitus during pregnancy, antepartum   5. Depression, recurrent (HCC)   6. Convulsions, unspecified convulsion type (HCC)     Plan: Get hgb a1c today  Increase levemir to 20am 12 pm  Increase novolog  To 10/21/11  Add back metformin 500 qd then increase to bid in one week - pt is already on 70 units insulin /day so addition of  an insulin sensitizer makes sense.  We are happy to assist with pt's care here at [facility] if helpful to her especially when her testing is twice weekly  If her blood sugars don't respond to aggressive increase in insulin as outpt ( she missed last weeks appointment due to delay at primary oB ) then admission may help - pt is beyond organogenesis currently  -Will need detailed u/s and  fetal echo with peds cardiology given high hgb A1c , also consider fetal echo near term given prior septal hypertrophy  - Monthly growth scans in third trimester and twice weekly testing starting at 30-32 weeks  -ERLTCS and BTL at 37 weeks (unlikely control will be adequate to delay further)  -recommend aspirin 81 mg  - needs opthalmology visit       Total time spent with the patient was 30 minutes with greater than 50% spent in counseling and coordination of care. We appreciate this interesting consult and will be happy to be involved in the ongoing care of Tamara Tate in anyway her obstetricians desire.  Jimmey RalphLivingston, Aaran Enberg MD  Maternal-Fetal  Medicine [facility]

## 2017-05-18 LAB — HEMOGLOBIN A1C
HEMOGLOBIN A1C: 12.1 % — AB (ref 4.8–5.6)
Mean Plasma Glucose: 301 mg/dL

## 2017-05-19 ENCOUNTER — Encounter: Payer: Self-pay | Admitting: Obstetrics and Gynecology

## 2017-05-19 LAB — FIRST TRIMESTER SCREEN W/NT
CRL: 67.2 mm
DIA MoM: 1.24
DIA Value: 242 pg/mL
GEST AGE-COLLECT: 12.9 wk
Maternal Age At EDD: 32.7 yr
NUCHAL TRANSLUCENCY MOM: 1.14
NUCHAL TRANSLUCENCY: 1.9 mm
NUMBER OF FETUSES: 1
PAPP-A MOM: 0.71
PAPP-A Value: 540.6 ng/mL
TEST RESULTS: NEGATIVE
WEIGHT: 197 [lb_av]
hCG MoM: 1.24
hCG Value: 87.4 IU/mL

## 2017-05-20 ENCOUNTER — Ambulatory Visit: Payer: Managed Care, Other (non HMO)

## 2017-06-10 ENCOUNTER — Encounter: Payer: Medicaid Other | Admitting: Certified Nurse Midwife

## 2017-06-15 ENCOUNTER — Telehealth: Payer: Self-pay | Admitting: Neurology

## 2017-06-15 NOTE — Telephone Encounter (Signed)
I tried called patient regarding disability papers that need to be filled out, the patient was kept out of work because she could not drive following a seizure event, she should have been able to go back to work in April 2018.  I'll try to call back later.

## 2017-06-16 NOTE — Telephone Encounter (Signed)
I called again, left message, I will call back later

## 2017-06-16 NOTE — Telephone Encounter (Signed)
I am unable to reach the patient, we will need to see her back before any disability papers are filled out.

## 2017-06-17 NOTE — Telephone Encounter (Signed)
Called and LVM for pt. Advised she needs to be seen in office first before CW,MD can fill out disability paperwork she dropped off. Advised CW,MD tried calling her as well, LVM. Gave [facility] phone number for her to call.

## 2017-06-18 NOTE — Telephone Encounter (Signed)
Pt called back re: message on 8-09, . Pt unable to come in before her appointment in Sept. With NPCarolyn.  Please call if this appointment will not be sufficient for Dr Anne HahnWillis to then be able to fill out her paperwork

## 2017-06-18 NOTE — Telephone Encounter (Signed)
Noted, patient has upcoming appt with CM,NP

## 2017-07-23 ENCOUNTER — Ambulatory Visit: Payer: Self-pay | Admitting: Nurse Practitioner

## 2017-07-26 ENCOUNTER — Observation Stay
Admission: EM | Admit: 2017-07-26 | Discharge: 2017-07-26 | Disposition: A | Discharge status: Home / Self Care | Payer: Medicaid Other | Attending: Obstetrics & Gynecology | Admitting: Obstetrics & Gynecology

## 2017-07-26 DIAGNOSIS — O2342 Unspecified infection of urinary tract in pregnancy, second trimester: Secondary | ICD-10-CM | POA: Diagnosis present

## 2017-07-26 DIAGNOSIS — O26892 Other specified pregnancy related conditions, second trimester: Secondary | ICD-10-CM | POA: Diagnosis not present

## 2017-07-26 DIAGNOSIS — O24912 Unspecified diabetes mellitus in pregnancy, second trimester: Secondary | ICD-10-CM | POA: Insufficient documentation

## 2017-07-26 DIAGNOSIS — Z3A23 23 weeks gestation of pregnancy: Secondary | ICD-10-CM | POA: Diagnosis not present

## 2017-07-26 DIAGNOSIS — R42 Dizziness and giddiness: Secondary | ICD-10-CM

## 2017-07-26 DIAGNOSIS — R51 Headache: Secondary | ICD-10-CM | POA: Diagnosis not present

## 2017-07-26 DIAGNOSIS — Z888 Allergy status to other drugs, medicaments and biological substances status: Secondary | ICD-10-CM | POA: Diagnosis not present

## 2017-07-26 DIAGNOSIS — N390 Urinary tract infection, site not specified: Secondary | ICD-10-CM

## 2017-07-26 HISTORY — DX: Dizziness and giddiness: R42

## 2017-07-26 HISTORY — DX: Urinary tract infection, site not specified: N39.0

## 2017-07-26 LAB — URINALYSIS, COMPLETE (UACMP) WITH MICROSCOPIC
Bilirubin Urine: NEGATIVE
Glucose, UA: NEGATIVE mg/dL
Hgb urine dipstick: NEGATIVE
KETONES UR: 5 mg/dL — AB
Nitrite: POSITIVE — AB
PH: 6 (ref 5.0–8.0)
PROTEIN: NEGATIVE mg/dL
Specific Gravity, Urine: 1.012 (ref 1.005–1.030)

## 2017-07-26 LAB — GLUCOSE, CAPILLARY: Glucose-Capillary: 109 mg/dL — ABNORMAL HIGH (ref 65–99)

## 2017-07-26 MED ORDER — ONDANSETRON HCL 4 MG/2ML IJ SOLN: 4.0000 mg | Freq: Four times a day (QID) | INTRAMUSCULAR | Status: DC | PRN

## 2017-07-26 MED ORDER — NITROFURANTOIN MONOHYD MACRO 100 MG PO CAPS
ORAL_CAPSULE | ORAL | Status: AC
  Filled 2017-07-26: qty 1

## 2017-07-26 MED ORDER — NITROFURANTOIN MONOHYD MACRO 100 MG PO CAPS: 100.0000 mg | ORAL_CAPSULE | Freq: Two times a day (BID) | ORAL | 0 refills | Status: DC

## 2017-07-26 MED ORDER — ACETAMINOPHEN 325 MG PO TABS: 650.0000 mg | ORAL_TABLET | ORAL | Status: DC | PRN

## 2017-07-26 MED ORDER — NITROFURANTOIN MONOHYD MACRO 100 MG PO CAPS
100.0000 mg | ORAL_CAPSULE | Freq: Two times a day (BID) | ORAL | Status: DC
  Administered 2017-07-26: 100 mg via ORAL

## 2017-07-26 NOTE — Discharge Summary (Signed)
  See FPN 

## 2017-07-26 NOTE — OB Triage Note (Signed)
Pt c/o dizziness, headache, braxton hicks for the last 4 days. She is trying to stay hydrated to help with headache.

## 2017-07-26 NOTE — Final Progress Note (Signed)
Physician Final Progress Note  Patient ID: Tamara Tate MRN: [id number] DOB/AGE: 1986/08/15 31 y.o.  Admit date: 07/26/2017 Admitting provider: Gae Dry, MD Discharge date: 07/26/2017   Admission Diagnoses: UTI, Dizziness, Headache, [redacted] weeks pregnant  Discharge Diagnoses:  Principal Problem:   UTI (urinary tract infection) Active Problems:   Dizziness  Consults: None  Significant Findings/ Diagnostic Studies: Patient presented for evaluation of labor and for dizziness/headache.  She has diabetes and is followed by [facility] for [facility].  Patient had cervical exam by RN and this was reported to me. I reviewed her vital signs and fetal tracing and labs, both of which were reassuring, and with evidence for UTI.  Patient was discharge as she was not laboring.  Results for orders placed or performed during the hospital encounter of 07/26/17  Urinalysis, Complete w Microscopic  Result Value Ref Range   Color, Urine YELLOW (A) YELLOW   APPearance HAZY (A) CLEAR   Specific Gravity, Urine 1.012 1.005 - 1.030   pH 6.0 5.0 - 8.0   Glucose, UA NEGATIVE NEGATIVE mg/dL   Hgb urine dipstick NEGATIVE NEGATIVE   Bilirubin Urine NEGATIVE NEGATIVE   Ketones, ur 5 (A) NEGATIVE mg/dL   Protein, ur NEGATIVE NEGATIVE mg/dL   Nitrite POSITIVE (A) NEGATIVE   Leukocytes, UA TRACE (A) NEGATIVE   RBC / HPF 0-5 0 - 5 RBC/hpf   WBC, UA 6-30 0 - 5 WBC/hpf   Bacteria, UA RARE (A) NONE SEEN   Squamous Epithelial / LPF 0-5 (A) NONE SEEN   Mucus PRESENT   Glucose, capillary  Result Value Ref Range   Glucose-Capillary 109 (H) 65 - 99 mg/dL   Procedures: FHT 140s  Discharge Condition: good  Disposition: 01-Home or Self Care  Diet: Regular diet  Discharge Activity: Activity as tolerated   Allergies as of 07/26/2017      Reactions   Keppra [levetiracetam]    Suicidal ideation   Other Hives   NUTS   Soy Allergy       Medication List    TAKE these medications   ACCU-CHEK FASTCLIX LANCETS  Misc 1 Units by Percutaneous route 4 (four) times daily.   ACCU-CHEK NANO SMARTVIEW w/Device Kit 1 kit by Subdermal route as directed. Check blood sugars for fasting, and two hours after breakfast, lunch and dinner (4 checks daily)   aspirin 81 MG chewable tablet Chew 81 mg by mouth daily.   glucose blood test strip Commonly known as:  ACCU-CHEK SMARTVIEW Use as instructed to check blood sugars   insulin aspart 100 UNIT/ML FlexPen Commonly known as:  NOVOLOG 7 units SQ with breakfast, 7 units SQ with dinner What changed:  how much to take  how to take this  additional instructions   Insulin Detemir 100 UNIT/ML Pen Commonly known as:  LEVEMIR FLEXPEN 16 units SQ before breakfast 8 units SQ before dinner   Insulin Pen Needle 32G X 6 MM Misc Commonly known as:  NOVOFINE Use as directed   metFORMIN 500 MG 24 hr tablet Commonly known as:  GLUCOPHAGE XR Take 1 tablet (500 mg total) by mouth daily with breakfast.   nitrofurantoin (macrocrystal-monohydrate) 100 MG capsule Commonly known as:  MACROBID Take 1 capsule (100 mg total) by mouth 2 (two) times daily.   prenatal multivitamin Tabs tablet Take 1 tablet by mouth daily at 12 noon.            Discharge Care Instructions        Start  Ordered   07/26/17 0000  nitrofurantoin, macrocrystal-monohydrate, (MACROBID) 100 MG capsule  2 times daily     07/26/17 1520       Total time spent taking care of this patient: TRAIGE  Signed: Hoyt Koch 07/26/2017, 3:21 PM

## 2017-07-26 NOTE — Discharge Instructions (Signed)

## 2017-07-27 ENCOUNTER — Telehealth: Payer: Self-pay | Admitting: Neurology

## 2017-07-27 NOTE — Telephone Encounter (Signed)
This patient has a history of seizures, last seizure was in October 2017. The patient was admitted to the hospital on 06/02/2017 for diabetic control, she currently is pregnant.  The patient had been on Keppra, she was switched to Lamictal but stopped the medication sometime in April or May 2018.  According to admission records, the patient has not had any recurrent seizures. The patient has been out of work because she was restricted from driving, she was told that she could return to driving in April 2018 if she had no recurrent seizures.  The patient is apparently still on disability, she will be coming in to the office in October 2018 for an evaluation.  The patient indicates that she had a seizure sometime in April 2018, not sure that she was even on medications at that time. She has been noncompliant.  The patient also has not been following up in the office, she has been canceling appointments.

## 2017-08-25 ENCOUNTER — Ambulatory Visit: Payer: Self-pay | Admitting: Nurse Practitioner

## 2017-09-17 ENCOUNTER — Telehealth: Payer: Self-pay | Admitting: Adult Health

## 2017-09-17 NOTE — Telephone Encounter (Signed)
This patient has not been on medication since April, we will initiate medication when we see her in the office.  She is in her third trimester of pregnancy, risk to the pregnancy is quite low.

## 2017-09-17 NOTE — Telephone Encounter (Signed)
Pt has a history of seizures, last one about 2 weeks ago. Pt has appt with Aundra MilletMegan 11/12, she a pt of Dr Anne HahnWillis.   FYI

## 2017-09-20 ENCOUNTER — Ambulatory Visit: Payer: Medicaid Other | Admitting: Adult Health

## 2017-09-20 ENCOUNTER — Encounter: Payer: Self-pay | Admitting: Adult Health

## 2017-09-20 VITALS — BP 112/71 | HR 95 | Wt 202.8 lb

## 2017-09-20 DIAGNOSIS — R569 Unspecified convulsions: Secondary | ICD-10-CM | POA: Diagnosis not present

## 2017-09-20 DIAGNOSIS — Z3493 Encounter for supervision of normal pregnancy, unspecified, third trimester: Secondary | ICD-10-CM

## 2017-09-20 MED ORDER — LAMOTRIGINE 25 MG PO TABS: ORAL_TABLET | ORAL | 3 refills | Status: DC

## 2017-09-20 NOTE — Progress Notes (Signed)
PATIENT: Tamara Tate DOB: Mar 01, 1986  REASON FOR VISIT: follow up- seizures, pregnancy HISTORY FROM: patient  HISTORY OF PRESENT ILLNESS: Today 09/20/17  Tamara Tate is a 31 year old female with a history of seizures.  She returns today for follow-up.  She is currently in her third trimester of pregnancy.  She reports that she has not been on seizure medications during this pregnancy.  She reports that this will be her third child.  During her previous 2 pregnancies she was also not on seizure medication.  She states that she did have a seizure during her first pregnancy but was not placed on medication.  Prior to her becoming pregnant she was on Lamictal. She states that she has had 2 seizures in the last 2 weeks.  She states that both seizures began by feeling very hot and flushed.  She got dizzy and then remembers waking up to her husband on top of her.  She states that her husband states that she was shaking in the arms and legs.  Denies loss of bladder or bowels.  She did not bite her tongue or gums.  She states that both seizures lasted less than 1 minute. the patient is interested in being on seizure medications.  She returns today for an evaluation.  HISTORY  01/20/17: Tamara Tate is a 31 year old right-handed black female with a history of a seizure disorder. The last seizure was on 08/11/2016. The patient has been placed on Lamictal, she is tolerating medication well. She has not had any problems with depression or suicidal ideation on this medication. She has not had any recurrent seizures. She remains on 75 mg twice daily. 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 The patient will have gone 6 months at the beginning of April 2018. If she has no seizures, she may return back to work. She returns for an evaluation. The patient is having headaches and dizziness about once a week, the patient will take Tylenol for this and the headache improves within about an  hour. The patient denies any photophobia, phonophobia, or nausea or vomiting.   REVIEW OF SYSTEMS: Out of a complete 14 system review of symptoms, the patient complains only of the following symptoms, and all other reviewed systems are negative.  Seizures, anemia, nervous/anxious, insomnia, excessive sweating  ALLERGIES: Allergies  Allergen Reactions  . Keppra [Levetiracetam]     Suicidal ideation  . Other Hives    NUTS  . Soy Allergy Itching    HOME MEDICATIONS: Outpatient Medications Prior to Visit  Medication Sig Dispense Refill  . ACCU-CHEK FASTCLIX LANCETS MISC 1 Units by Percutaneous route 4 (four) times daily. 100 each 12  . aspirin 81 MG chewable tablet Chew 81 mg by mouth daily.    . Blood Glucose Monitoring Suppl (ACCU-CHEK NANO SMARTVIEW) w/Device KIT 1 kit by Subdermal route as directed. Check blood sugars for fasting, and two hours after breakfast, lunch and dinner (4 checks daily) 1 kit 0  . glucose blood (ACCU-CHEK SMARTVIEW) test strip Use as instructed to check blood sugars 100 each 12  . insulin aspart (NOVOLOG) 100 UNIT/ML FlexPen 7 units SQ with breakfast, 7 units SQ with dinner (Patient taking differently: Inject 7 Units into the skin. 7 units SQ with breakfast, 7 units SQ with dinner) 15 mL 11  . Insulin Detemir (LEVEMIR FLEXPEN) 100 UNIT/ML Pen 16 units SQ before breakfast 8 units SQ before dinner 15 mL 11  . Insulin Pen Needle (  NOVOFINE) 32G X 6 MM MISC Use as directed 100 each 3  . metFORMIN (GLUCOPHAGE XR) 500 MG 24 hr tablet Take 1 tablet (500 mg total) by mouth daily with breakfast. 90 tablet 0  . nitrofurantoin, macrocrystal-monohydrate, (MACROBID) 100 MG capsule Take 1 capsule (100 mg total) by mouth 2 (two) times daily. 10 capsule 0  . Prenatal Vit-Fe Fumarate-FA (PRENATAL MULTIVITAMIN) TABS tablet Take 1 tablet by mouth daily at 12 noon.     No facility-administered medications prior to visit.     PAST MEDICAL HISTORY: Past Medical History:    Diagnosis Date  . Anxiety   . Diabetes mellitus without complication ([place])   . Hyperlipidemia   . Seasonal allergies   . Seizure ([place])     PAST SURGICAL HISTORY: Past Surgical History:  Procedure Laterality Date  . CESAREAN SECTION     x 2    FAMILY HISTORY: Family History  Problem Relation Age of Onset  . Diabetes Mother   . Hypertension Mother   . Asthma Father   . Diabetes Maternal Grandmother   . Cancer Maternal Grandfather        Leukemia  . Cancer Paternal Grandfather        Prostate  . Breast cancer Maternal Aunt     SOCIAL HISTORY: Social History   Socioeconomic History  . Marital status: Married    Spouse name: Not on file  . Number of children: 2  . Years of education: BS  . Highest education level: Not on file  Social Needs  . Financial resource strain: Not on file  . Food insecurity - worry: Not on file  . Food insecurity - inability: Not on file  . Transportation needs - medical: Not on file  . Transportation needs - non-medical: Not on file  Occupational History  . Occupation: Forensic scientist for [employer]  Tobacco Use  . Smoking status: Never Smoker  . Smokeless tobacco: Never Used  Substance and Sexual Activity  . Alcohol use: Yes    Comment: 1 drink per week  . Drug use: No  . Sexual activity: Yes    Partners: Male    Birth control/protection: None    Comment: Married  Other Topics Concern  . Not on file  Social History Narrative   Lives at home w/ her husband and 2 children   Right-handed   Caffeine: 1 soda per day      PHYSICAL EXAM  Vitals:   09/20/17 0902  BP: 112/71  Pulse: 95  Weight: 202 lb 12.8 oz (92 kg)   Body mass index is 37.09 kg/m.  Generalized: Well developed, in no acute distress   Neurological examination  Mentation: Alert oriented to time, place, history taking. Follows all commands speech and language fluent Cranial nerve II-XII: Pupils were equal round reactive to light. Extraocular movements  were full, visual field were full on confrontational test. Facial sensation and strength were normal. Uvula tongue midline. Head turning and shoulder shrug  were normal and symmetric. Motor: The motor testing reveals 5 over 5 strength of all 4 extremities. Good symmetric motor tone is noted throughout.  Sensory: Sensory testing is intact to soft touch on all 4 extremities. No evidence of extinction is noted.  Coordination: Cerebellar testing reveals good finger-nose-finger and heel-to-shin bilaterally.  Gait and station: Gait is normal. Tandem gait is normal. Romberg is negative. No drift is seen.  Reflexes: Deep tendon reflexes are symmetric and normal bilaterally.   DIAGNOSTIC DATA (LABS, IMAGING, TESTING) -  I reviewed patient records, labs, notes, testing and imaging myself where available.  Lab Results  Component Value Date   WBC 9.9 04/15/2017   HGB 10.6 (L) 04/15/2017   HCT 34.7 04/15/2017   MCV 78 (L) 04/15/2017   PLT 387 (H) 04/15/2017      Component Value Date/Time   NA 132 (L) 04/15/2017 1544   NA 133 (L) 03/07/2015 1119   K 4.1 04/15/2017 1544   K 4.1 03/07/2015 1119   CL 99 04/15/2017 1544   CL 101 03/07/2015 1119   CO2 19 04/15/2017 1544   CO2 25 03/07/2015 1119   GLUCOSE 357 (H) 04/15/2017 1544   GLUCOSE 434 (H) 03/07/2015 1119   BUN 12 04/15/2017 1544   BUN 10 03/07/2015 1119   CREATININE 0.62 04/15/2017 1544   CREATININE 0.61 03/07/2015 1119   CALCIUM 9.5 04/15/2017 1544   CALCIUM 9.0 03/07/2015 1119   PROT 7.3 04/15/2017 1544   PROT 7.8 03/07/2015 1119   ALBUMIN 3.7 04/15/2017 1544   ALBUMIN 3.6 03/07/2015 1119   AST 11 04/15/2017 1544   AST 23 03/07/2015 1119   ALT 9 04/15/2017 1544   ALT 17 03/07/2015 1119   ALKPHOS 59 04/15/2017 1544   ALKPHOS 65 03/07/2015 1119   BILITOT <0.2 04/15/2017 1544   BILITOT 0.4 03/07/2015 1119   GFRNONAA 120 04/15/2017 1544   GFRNONAA >60 03/07/2015 1119   GFRAA 138 04/15/2017 1544   GFRAA >60 03/07/2015 1119       ASSESSMENT AND PLAN 31 y.o. year old female  has a past medical history of Anxiety, Diabetes mellitus without complication ([place]), Hyperlipidemia, Seasonal allergies, and Seizure ([place]). here with:  1.  Seizures 2.  Pregnancy-third trimester  I discussed the patient's plan of care with Dr. Jannifer Franklin.  Since she has an intolerance to Keppra.  She will be started on Lamictal.  She will begin by taking 25 mg twice a day for 1 week and then she will increase her dose to 50 mg twice a day for the second week, then 75 mg BID for the third  week. If she is tolerating this well she will call and on the fourth week she will be increased to 100 mg BID.  I have reviewed potential side effects of Lamictal with the patient. I did explain that Lamictal is a pregnancy category C however because she is in the third trimester having a convulsive seizure poses more risks to the baby than the medication.  She voiced understanding.  She is advised that if she has any additional seizure events she should let us know.  She will follow-up in 3 months or sooner if needed.  I spent 25 minutes with the patient. 50% of this time was spent discussing pregnancy and seizures.    Ward Givens, MSN, NP-C 09/20/2017, 9:26 AM [facility] [address] [place], [place] [address] [phone]

## 2017-09-20 NOTE — Progress Notes (Signed)
I have read the note, and I agree with the clinical assessment and plan.  Kenedy Haisley KEITH   

## 2017-09-20 NOTE — Patient Instructions (Signed)
Your Plan:  Start Lamictal  Week 1: 25 mg twice a day Week 2: 50 mg Twice a day Week 3: 75 mg Twice a day If tolerating please call and I will send in 100 mg tablet to start on week 4.  If your symptoms worsen or you develop new symptoms please let us know.    Thank you for [facility] at [facility]. I hope we have been able to provide you high quality care today.  You may receive a patient satisfaction survey over the next few weeks. We would appreciate your feedback and comments so that we may continue to improve ourselves and the health of our patients.  Lamotrigine tablets What is this medicine? LAMOTRIGINE (la MOE Patrecia Pacetri jeen) is used to control seizures in adults and children with epilepsy and Lennox-Gastaut syndrome. It is also used in adults to treat bipolar disorder. This medicine may be used for other purposes; ask your health care provider or pharmacist if you have questions. COMMON BRAND NAME(S): Lamictal What should I tell my health care provider before I take this medicine? They need to know if you have any of these conditions: -a history of depression or bipolar disorder -aseptic meningitis during prior use of lamotrigine -folate deficiency -kidney disease -liver disease -suicidal thoughts, plans, or attempt; a previous suicide attempt by you or a family member -an unusual or allergic reaction to lamotrigine or other seizure medications, other medicines, foods, dyes, or preservatives -pregnant or trying to get pregnant -breast-feeding How should I use this medicine? Take this medicine by mouth with a glass of water. Follow the directions on the prescription label. Do not chew these tablets. If this medicine upsets your stomach, take it with food or milk. Take your doses at regular intervals. Do not take your medicine more often than directed. A special MedGuide will be given to you by the pharmacist with each new prescription and refill. Be sure  to read this information carefully each time. Talk to your pediatrician regarding the use of this medicine in children. While this drug may be prescribed for children as young as 2 years for selected conditions, precautions do apply. Overdosage: If you think you have taken too much of this medicine contact a poison control center or emergency room at once. NOTE: This medicine is only for you. Do not share this medicine with others. What if I miss a dose? If you miss a dose, take it as soon as you can. If it is almost time for your next dose, take only that dose. Do not take double or extra doses. What may interact with this medicine? -carbamazepine -female hormones, including contraceptive or birth control pills -methotrexate -phenobarbital -phenytoin -primidone -pyrimethamine -rifampin -trimethoprim -valproic acid This list may not describe all possible interactions. Give your health care provider a list of all the medicines, herbs, non-prescription drugs, or dietary supplements you use. Also tell them if you smoke, drink alcohol, or use illegal drugs. Some items may interact with your medicine. What should I watch for while using this medicine? Visit your doctor or health care professional for regular checks on your progress. If you take this medicine for seizures, wear a Medic Alert bracelet or necklace. Carry an identification card with information about your condition, medicines, and doctor or health care professional. It is important to take this medicine exactly as directed. When first starting treatment, your dose will need to be adjusted slowly. It may take weeks or months before your  dose is stable. You should contact your doctor or health care professional if your seizures get worse or if you have any new types of seizures. Do not stop taking this medicine unless instructed by your doctor or health care professional. Stopping your medicine suddenly can increase your seizures or their  severity. Contact your doctor or health care professional right away if you develop a rash while taking this medicine. Rashes may be very severe and sometimes require treatment in the hospital. Deaths from rashes have occurred. Serious rashes occur more often in children than adults taking this medicine. It is more common for these serious rashes to occur during the first 2 months of treatment, but a rash can occur at any time. You may get drowsy, dizzy, or have blurred vision. Do not drive, use machinery, or do anything that needs mental alertness until you know how this medicine affects you. To reduce dizzy or fainting spells, do not sit or stand up quickly, especially if you are an older patient. Alcohol can increase drowsiness and dizziness. Avoid alcoholic drinks. If you are taking this medicine for bipolar disorder, it is important to report any changes in your mood to your doctor or health care professional. If your condition gets worse, you get mentally depressed, feel very hyperactive or manic, have difficulty sleeping, or have thoughts of hurting yourself or committing suicide, you need to get help from your health care professional right away. If you are a caregiver for someone taking this medicine for bipolar disorder, you should also report these behavioral changes right away. The use of this medicine may increase the chance of suicidal thoughts or actions. Pay special attention to how you are responding while on this medicine. Your mouth may get dry. Chewing sugarless gum or sucking hard candy, and drinking plenty of water may help. Contact your doctor if the problem does not go away or is severe. Women who become pregnant while using this medicine may enroll in the [place] Antiepileptic Drug Pregnancy Registry by calling [phone]. This registry collects information about the safety of antiepileptic drug use during pregnancy. What side effects may I notice from receiving this  medicine? Side effects that you should report to your doctor or health care professional as soon as possible: -allergic reactions like skin rash, itching or hives, swelling of the face, lips, or tongue -blurred or double vision -difficulty walking or controlling muscle movements -fever -headache, stiff neck, and sensitivity to light -painful sores in the mouth, eyes, or nose -redness, blistering, peeling or loosening of the skin, including inside the mouth -severe muscle pain -swollen lymph glands -uncontrollable eye movements -unusual bruising or bleeding -unusually weak or tired -vomiting -worsening of mood, thoughts or actions of suicide or dying -yellowing of the eyes or skin Side effects that usually do not require medical attention (report to your doctor or health care professional if they continue or are bothersome): -diarrhea or constipation -difficulty sleeping -nausea -tremors This list may not describe all possible side effects. Call your doctor for medical advice about side effects. You may report side effects to FDA at 1-800-FDA-1088. Where should I keep my medicine? Keep out of reach of children. Store at room temperature between 15 and 30 degrees C (59 and 86 degrees F). Throw away any unused medicine after the expiration date. NOTE: This sheet is a summary. It may not cover all possible information. If you have questions about this medicine, talk to your doctor, pharmacist, or health care provider.  2018  Elsevier/Gold Standard (2015-11-28 09:29:40)

## 2017-09-27 ENCOUNTER — Telehealth: Payer: Self-pay | Admitting: *Deleted

## 2017-09-27 NOTE — Telephone Encounter (Signed)
LVM advising patient that Tamara MilletMegan wanted to know if she is taking Folic acid 1 mg once daily. Requested she call back to inform, left number.

## 2017-10-07 NOTE — Telephone Encounter (Signed)
This RN noted that [facility]. She was prescribed Folic acid on 40/98/1111/17/18 with 11 refills per Care Everywhere.

## 2018-01-19 ENCOUNTER — Ambulatory Visit: Payer: Medicaid Other | Admitting: Adult Health

## 2018-02-10 ENCOUNTER — Encounter (HOSPITAL_COMMUNITY): Payer: Self-pay

## 2018-03-02 ENCOUNTER — Telehealth: Payer: Self-pay | Admitting: Neurology

## 2018-03-02 NOTE — Telephone Encounter (Signed)
I called the patient.  The patient is on Lamictal, she works as a Software engineerpatient coordinator, her job requires driving.  The patient claims that she had a seizure towards the end of March 2019, she did not go to the hospital, she did not call our office to inform us of this.  She canceled her appointment on 19 January 2018, she is now scheduled in July, but we will try to get a sooner appointment to check Lamictal levels and make adjustments to this.  The patient is not to operate a motor vehicle for 6 months from the last seizure.

## 2018-03-03 NOTE — Telephone Encounter (Signed)
Called pt. Scheduled f/u for 03/25/17 at 12pm, check in 1130am. Pt verbalized understanding. Agreeable to date/time.

## 2018-03-03 NOTE — Telephone Encounter (Signed)
Gave completed/signed disability form to medical records to process for pt.  Per Dr. Anne HahnWillis, she cannot drive for 6 months from date of last seizure which was end of March 2019. Can return to work full duty on 08/09/2018.

## 2018-03-09 NOTE — Telephone Encounter (Signed)
Pt [employer] form faxed on 03/08/18 to [phone]

## 2018-03-25 ENCOUNTER — Encounter: Payer: Self-pay | Admitting: Neurology

## 2018-03-25 ENCOUNTER — Ambulatory Visit (INDEPENDENT_AMBULATORY_CARE_PROVIDER_SITE_OTHER): Payer: Medicaid Other | Admitting: Neurology

## 2018-03-25 VITALS — BP 107/70 | HR 72 | Ht 62.0 in | Wt 188.0 lb

## 2018-03-25 DIAGNOSIS — R569 Unspecified convulsions: Secondary | ICD-10-CM

## 2018-03-25 MED ORDER — LAMOTRIGINE 100 MG PO TABS: 100.0000 mg | ORAL_TABLET | Freq: Two times a day (BID) | ORAL | 3 refills | Status: DC

## 2018-03-25 NOTE — Patient Instructions (Signed)
   With the Lamictal 100 mg:  For 2 weeks, take 1/2 tablet in the morning and 1 full tablet in the evening, then   Take one tablet twice a day.

## 2018-03-25 NOTE — Progress Notes (Signed)
Reason for visit: Seizures  Tamara Tate is an 32 y.o. female  History of present illness:  Tamara Tate is a 32 year old right-handed black female with a history of diabetes and seizures.  The patient recently delivered a child, she came out of the hospital and apparently was told to go to 100 mg daily of Lamictal, she should have been on 100 mg twice daily.  The patient had a seizure about 1 month ago, she had just put her baby into the crib and she blacked out, she woke up on the floor, she did not bite her tongue or lose control of the bowels or the bladder.  The patient is not operating a motor vehicle.  The patient has been breast-feeding without difficulty.  She returns for an evaluation.  Past Medical History:  Diagnosis Date  . Anxiety   . Diabetes mellitus without complication ([place])   . Hyperlipidemia   . Seasonal allergies   . Seizure [facility])     Past Surgical History:  Procedure Laterality Date  . CESAREAN SECTION     x 2    Family History  Problem Relation Age of Onset  . Diabetes Mother   . Hypertension Mother   . Asthma Father   . Diabetes Maternal Grandmother   . Cancer Maternal Grandfather        Leukemia  . Cancer Paternal Grandfather        Prostate  . Breast cancer Maternal Aunt     Social history:  reports that she has never smoked. She has never used smokeless tobacco. She reports that she drinks alcohol. She reports that she does not use drugs.    Allergies  Allergen Reactions  . Keppra [Levetiracetam]     Suicidal ideation  . Other Hives    NUTS  . Soy Allergy Itching    Medications:  Prior to Admission medications   Medication Sig Start Date End Date Taking? Authorizing Provider  ACCU-CHEK FASTCLIX LANCETS MISC 1 Units by Percutaneous route 4 (four) times daily. 04/16/17  Yes Malachy Mood, MD  Blood Glucose Monitoring Suppl (ACCU-CHEK NANO SMARTVIEW) w/Device KIT 1 kit by Subdermal route as directed. Check blood sugars for fasting, and  two hours after breakfast, lunch and dinner (4 checks daily) 04/16/17  Yes Malachy Mood, MD  glucose blood (ACCU-CHEK SMARTVIEW) test strip Use as instructed to check blood sugars 04/16/17  Yes Malachy Mood, MD  insulin aspart (NOVOLOG) 100 UNIT/ML FlexPen 7 units SQ with breakfast, 7 units SQ with dinner Patient taking differently: Inject 7 Units into the skin. 7 units SQ with breakfast, 7 units SQ with dinner 04/16/17  Yes Malachy Mood, MD  Insulin Detemir (LEVEMIR FLEXPEN) 100 UNIT/ML Pen 16 units SQ before breakfast 8 units SQ before dinner 04/16/17  Yes Malachy Mood, MD  lamoTRIgine (LAMICTAL) 100 MG tablet Take 1 tablet (100 mg total) by mouth 2 (two) times daily. 03/25/18  Yes Kathrynn Ducking, MD  metFORMIN (GLUCOPHAGE XR) 500 MG 24 hr tablet Take 1 tablet (500 mg total) by mouth daily with breakfast. 08/26/16  Yes Volney American, PA-C    ROS:  Out of a complete 14 system review of symptoms, the patient complains only of the following symptoms, and all other reviewed systems are negative.  Food allergies Memory loss, dizziness, headache, seizure, passing out  Blood pressure 107/70, pulse 72, height 5' 2"  (1.575 m), weight 188 lb (85.3 kg), unknown if currently breastfeeding.  Physical Exam  General: The patient  is alert and cooperative at the time of the examination.  The patient is markedly obese.  Skin: No significant peripheral edema is noted.   Neurologic Exam  Mental status: The patient is alert and oriented x 3 at the time of the examination. The patient has apparent normal recent and remote memory, with an apparently normal attention span and concentration ability.   Cranial nerves: Facial symmetry is present. Speech is normal, no aphasia or dysarthria is noted. Extraocular movements are full. Visual fields are full.  Motor: The patient has good strength in all 4 extremities.  Sensory examination: Soft touch sensation is symmetric on the face,  arms, and legs.  Coordination: The patient has good finger-nose-finger and heel-to-shin bilaterally.  Gait and station: The patient has a normal gait. Tandem gait is normal. Romberg is negative. No drift is seen.  Reflexes: Deep tendon reflexes are symmetric.   Assessment/Plan:  1.  History of seizures, recent recurrence  The patient has not been on the proper dose of the Lamictal, she will increase the dose to 150 mg daily for 2 weeks and then go to 100 mg twice daily.  A prescription was sent in.  She will follow-up in 6 months.  The safety with breast-feeding is unknown, but is felt to be low.  The patient will be vigilant if her infant becomes lethargic, she will contact our office.  [name] MD 03/25/2018 12:04 PM  [facility] [address] [place], [place] [address]  Phone [phone] Fax [phone]

## 2018-05-10 ENCOUNTER — Ambulatory Visit: Payer: Medicaid Other | Admitting: Adult Health

## 2018-05-10 ENCOUNTER — Encounter

## 2018-05-24 DIAGNOSIS — Z0289 Encounter for other administrative examinations: Secondary | ICD-10-CM

## 2018-05-26 ENCOUNTER — Telehealth: Payer: Self-pay | Admitting: *Deleted

## 2018-05-26 NOTE — Telephone Encounter (Signed)
Form on Dr. Anne HahnWillis desk to review/sign once he returns to the office next week.

## 2018-05-30 ENCOUNTER — Telehealth: Payer: Self-pay | Admitting: *Deleted

## 2018-05-30 NOTE — Telephone Encounter (Signed)
I faxed pt [employer] form on 05/30/18 to [phone]

## 2018-05-30 NOTE — Telephone Encounter (Signed)
Gave completed/signed form back to medical records to process for pt. 

## 2018-08-10 ENCOUNTER — Telehealth: Payer: Self-pay | Admitting: Neurology

## 2018-08-10 MED ORDER — LAMOTRIGINE 150 MG PO TABS: 150.0000 mg | ORAL_TABLET | Freq: Two times a day (BID) | ORAL | 2 refills | Status: DC

## 2018-08-10 NOTE — Addendum Note (Signed)
Addended by: York Spaniel on: 08/10/2018 01:33 PM   Modules accepted: Orders

## 2018-08-10 NOTE — Telephone Encounter (Signed)
I called patient back. She denies any more seizure activity since last Saturday. She denies missing any medication doses, injuries or starting any new medication recently. She has had an increase in stress. She also has been having dizzy spells, feeling light-headed. She also c/o memory loss, she gets disoriented easily. She will go somewhere she is familiar with and forgets why she is there. She has increased anxiety with this. She is having to write things down more to remember them. I offered appt today at 10am with Dr. Anne Hahn d/t him having a cx. She declined.  I scheduled her for an earlier appt on 08/17/18 at 12pm with Dr. Anne Hahn. I cancelled appt she had in November.  Advised I will send message to Dr. Anne Hahn to review and make sure he does not want to make any changes prior to her appt next week. She verbalized understanding.

## 2018-08-10 NOTE — Telephone Encounter (Signed)
Patient had a seizure last Saturday.  She has an appointment with Dr. Anne Hahn on 09-27-18 but would like to be seen sooner. I offered appointment with Aundra Millet this Thursday but she could not come.

## 2018-08-10 NOTE — Telephone Encounter (Signed)
I called the patient.  She is to start Lamictal 100 mg in the morning and 150 mg in the evening.  I will see her in the office next week.  I will call in a prescription for the 150 mg Lamictal tablets.

## 2018-08-17 ENCOUNTER — Encounter: Payer: Self-pay | Admitting: Neurology

## 2018-08-17 ENCOUNTER — Ambulatory Visit: Payer: Medicaid Other | Admitting: Neurology

## 2018-08-17 ENCOUNTER — Other Ambulatory Visit: Payer: Self-pay

## 2018-08-17 VITALS — BP 108/78 | HR 72 | Resp 16 | Ht 62.0 in | Wt 202.0 lb

## 2018-08-17 DIAGNOSIS — R569 Unspecified convulsions: Secondary | ICD-10-CM

## 2018-08-17 NOTE — Progress Notes (Signed)
Reason for visit: Seizures   Tamara Tate is an 32 y.o. female  History of present illness:  Tamara Tate is a 32 year old right-handed black female with a history of a seizure disorder.  She has been converted from [place] to Lamictal, she could not tolerate the Keppra.  The patient has had more frequent events of sensory episodes that involve a numbness sensation of the right arm that spreads up and then eventually involves the back.  She had a subsequent blackout event following this on 04 August 2018.  The patient did not bite her tongue or lose control of the bowels or the bladder.  The patient was increased on the Lamictal following this, the patient is working up to 150 mg twice daily.  She is tolerating Lamictal fairly well.  The patient has not been operating a motor vehicle.  She denies any new issues such as numbness or weakness or balance problems that are persistent.  Past Medical History:  Diagnosis Date  . Anxiety   . Diabetes mellitus without complication ([place])   . Hyperlipidemia   . Seasonal allergies   . Seizure [facility])     Past Surgical History:  Procedure Laterality Date  . CESAREAN SECTION     x 2    Family History  Problem Relation Age of Onset  . Diabetes Mother   . Hypertension Mother   . Asthma Father   . Diabetes Maternal Grandmother   . Cancer Maternal Grandfather        Leukemia  . Cancer Paternal Grandfather        Prostate  . Breast cancer Maternal Aunt     Social history:  reports that she has never smoked. She has never used smokeless tobacco. She reports that she drinks alcohol. She reports that she does not use drugs.    Allergies  Allergen Reactions  . Keppra [Levetiracetam]     Suicidal ideation  . Other Hives    NUTS  . Soy Allergy Itching    Medications:  Prior to Admission medications   Medication Sig Start Date End Date Taking? Authorizing Provider  ACCU-CHEK FASTCLIX LANCETS MISC 1 Units by Percutaneous route 4 (four)  times daily. 04/16/17  Yes Malachy Mood, MD  Blood Glucose Monitoring Suppl (ACCU-CHEK NANO SMARTVIEW) w/Device KIT 1 kit by Subdermal route as directed. Check blood sugars for fasting, and two hours after breakfast, lunch and dinner (4 checks daily) 04/16/17  Yes Malachy Mood, MD  glucose blood (ACCU-CHEK SMARTVIEW) test strip Use as instructed to check blood sugars 04/16/17  Yes Malachy Mood, MD  lamoTRIgine (LAMICTAL) 150 MG tablet Take 1 tablet (150 mg total) by mouth 2 (two) times daily. 08/10/18  Yes Kathrynn Ducking, MD  metFORMIN (GLUCOPHAGE XR) 500 MG 24 hr tablet Take 1 tablet (500 mg total) by mouth daily with breakfast. 08/26/16  Yes Volney American, PA-C  insulin aspart (NOVOLOG) 100 UNIT/ML FlexPen 7 units SQ with breakfast, 7 units SQ with dinner Patient not taking: Reported on 08/17/2018 04/16/17   Malachy Mood, MD  Insulin Detemir (LEVEMIR FLEXPEN) 100 UNIT/ML Pen 16 units SQ before breakfast 8 units SQ before dinner Patient not taking: Reported on 08/17/2018 04/16/17   Malachy Mood, MD    ROS:  Out of a complete 14 system review of symptoms, the patient complains only of the following symptoms, and all other reviewed systems are negative.  Seizures  Blood pressure 108/78, pulse 72, resp. rate 16, height 5' 2"  (1.575 m),  weight 202 lb (91.6 kg), unknown if currently breastfeeding.  Physical Exam  General: The patient is alert and cooperative at the time of the examination.  The patient is moderately to markedly obese.  Skin: No significant peripheral edema is noted.   Neurologic Exam  Mental status: The patient is alert and oriented x 3 at the time of the examination. The patient has apparent normal recent and remote memory, with an apparently normal attention span and concentration ability.   Cranial nerves: Facial symmetry is present. Speech is normal, no aphasia or dysarthria is noted. Extraocular movements are full. Visual fields are  full.  Motor: The patient has good strength in all 4 extremities.  Sensory examination: Soft touch sensation is symmetric on the face, arms, and legs.  Coordination: The patient has good finger-nose-finger and heel-to-shin bilaterally.  Gait and station: The patient has a normal gait. Tandem gait is normal. Romberg is negative. No drift is seen.  Reflexes: Deep tendon reflexes are symmetric.   Assessment/Plan:  1.  Seizures, with recent recurrence  The patient appears to have a sensory aura to the seizure, these have been occurring once or twice a week.  The patient will increase the Lamictal 250 mg twice daily, if she continues to have these sensory episodes, she is to contact our office and we will continue to increase the Lamictal.  The patient will follow-up in 4 months.  She is not to operate a motor vehicle.  [name] MD 08/17/2018 12:07 PM  [facility] [address] [place], [place] [address]  Phone [phone] Fax [phone]

## 2018-08-31 ENCOUNTER — Telehealth: Payer: Self-pay | Admitting: *Deleted

## 2018-08-31 NOTE — Telephone Encounter (Signed)
Pt [place] form on [employer].

## 2018-09-01 DIAGNOSIS — Z0289 Encounter for other administrative examinations: Secondary | ICD-10-CM

## 2018-09-02 ENCOUNTER — Telehealth: Payer: Self-pay | Admitting: *Deleted

## 2018-09-02 NOTE — Telephone Encounter (Signed)
Gave completed/signed forms back to medical records to process for pt.   

## 2018-09-02 NOTE — Telephone Encounter (Signed)
I faxed pt form to [facility] on 09/02/18

## 2018-09-06 ENCOUNTER — Telehealth: Payer: Self-pay | Admitting: Neurology

## 2018-09-06 MED ORDER — LAMOTRIGINE 200 MG PO TABS: 200.0000 mg | ORAL_TABLET | Freq: Two times a day (BID) | ORAL | 3 refills | Status: AC

## 2018-09-06 NOTE — Telephone Encounter (Signed)
Patient is continuing to have seizures and would like her lamoTRIgine (LAMICTAL) 150 MG tablet increased as stated in last office visit with Dr. Anne Hahn.

## 2018-09-06 NOTE — Addendum Note (Signed)
Addended by: York Spaniel on: 09/06/2018 04:14 PM   Modules accepted: Orders

## 2018-09-06 NOTE — Telephone Encounter (Signed)
Spoke with Tamara Tate. She sts. she continues to have what Dr. Anne Hahn told her are "localized seizures."  Numbness both arms, right worse than left, bilat leg weakness, numbness, nausea, feeling off balance, disoriented. She confirmed she is currently taking Lamictal 250mg  bid/fim

## 2018-09-06 NOTE — Telephone Encounter (Signed)
I called the patient. She is having frequent episodes of numbness, no further blackouts.  She may have up to 10 a day, episodes last up to 30 seconds. She will go up on the dose of the lamictal to 200 mg twice a day, she will take 150/ 200 for 2 weeks, then go to 200 mg twice a day.

## 2018-09-27 ENCOUNTER — Ambulatory Visit: Payer: Medicaid Other | Admitting: Neurology

## 2018-12-19 ENCOUNTER — Ambulatory Visit: Payer: Medicaid Other | Admitting: Nurse Practitioner

## 2020-05-17 ENCOUNTER — Ambulatory Visit: Payer: Medicaid Other | Admitting: Obstetrics & Gynecology

## 2020-06-14 ENCOUNTER — Ambulatory Visit: Payer: Medicaid Other | Admitting: Obstetrics & Gynecology

## 2020-07-10 ENCOUNTER — Ambulatory Visit: Payer: Medicaid Other | Admitting: Obstetrics & Gynecology

## 2020-08-14 ENCOUNTER — Ambulatory Visit: Payer: Medicaid Other | Admitting: Obstetrics & Gynecology

## 2021-01-13 ENCOUNTER — Ambulatory Visit (LOCAL_COMMUNITY_HEALTH_CENTER): Payer: Self-pay

## 2021-01-13 ENCOUNTER — Other Ambulatory Visit: Payer: Self-pay

## 2021-01-13 DIAGNOSIS — Z111 Encounter for screening for respiratory tuberculosis: Secondary | ICD-10-CM

## 2021-01-16 ENCOUNTER — Other Ambulatory Visit: Payer: Self-pay

## 2021-01-16 ENCOUNTER — Ambulatory Visit (LOCAL_COMMUNITY_HEALTH_CENTER): Payer: Managed Care, Other (non HMO)

## 2021-01-16 DIAGNOSIS — Z111 Encounter for screening for respiratory tuberculosis: Secondary | ICD-10-CM

## 2021-01-16 LAB — TB SKIN TEST
Induration: 0 mm
TB Skin Test: NEGATIVE

## 2021-08-11 ENCOUNTER — Ambulatory Visit: Payer: Medicaid Other | Admitting: Obstetrics

## 2021-12-15 ENCOUNTER — Telehealth: Payer: Self-pay

## 2021-12-15 NOTE — Telephone Encounter (Signed)
Pt calling; has a d/c of mucus and yeast inf.  [phone]

## 2021-12-15 NOTE — Telephone Encounter (Signed)
Contacted patient via phone. Patient is aware of her new patient status and that we aren't scheduling new patient appointment's at this time. Advise patient to follow up with [facility], [facility] or local health dept

## 2021-12-22 ENCOUNTER — Encounter: Payer: Self-pay | Admitting: Certified Nurse Midwife

## 2022-02-16 ENCOUNTER — Encounter: Payer: Self-pay | Admitting: Certified Nurse Midwife

## 2022-03-16 ENCOUNTER — Emergency Department
Admission: EM | Admit: 2022-03-16 | Discharge: 2022-03-16 | Disposition: A | Discharge status: Home / Self Care | Payer: Managed Care, Other (non HMO) | Attending: Emergency Medicine | Admitting: Emergency Medicine

## 2022-03-16 ENCOUNTER — Other Ambulatory Visit: Payer: Self-pay

## 2022-03-16 ENCOUNTER — Emergency Department: Payer: Managed Care, Other (non HMO)

## 2022-03-16 ENCOUNTER — Encounter: Payer: Self-pay | Admitting: Emergency Medicine

## 2022-03-16 DIAGNOSIS — R197 Diarrhea, unspecified: Secondary | ICD-10-CM | POA: Diagnosis not present

## 2022-03-16 DIAGNOSIS — R61 Generalized hyperhidrosis: Secondary | ICD-10-CM | POA: Insufficient documentation

## 2022-03-16 DIAGNOSIS — N12 Tubulo-interstitial nephritis, not specified as acute or chronic: Secondary | ICD-10-CM | POA: Diagnosis not present

## 2022-03-16 DIAGNOSIS — E119 Type 2 diabetes mellitus without complications: Secondary | ICD-10-CM | POA: Insufficient documentation

## 2022-03-16 DIAGNOSIS — R1031 Right lower quadrant pain: Secondary | ICD-10-CM | POA: Diagnosis present

## 2022-03-16 LAB — COMPREHENSIVE METABOLIC PANEL
ALT: 24 U/L (ref 0–44)
AST: 15 U/L (ref 15–41)
Albumin: 3.6 g/dL (ref 3.5–5.0)
Alkaline Phosphatase: 62 U/L (ref 38–126)
Anion gap: 12 (ref 5–15)
BUN: 15 mg/dL (ref 6–20)
CO2: 20 mmol/L — ABNORMAL LOW (ref 22–32)
Calcium: 9.2 mg/dL (ref 8.9–10.3)
Chloride: 98 mmol/L (ref 98–111)
Creatinine, Ser: 0.72 mg/dL (ref 0.44–1.00)
GFR, Estimated: >60 mL/min (ref >60)
Glucose, Bld: 366 mg/dL — ABNORMAL HIGH (ref 70–99)
Potassium: 3.6 mmol/L (ref 3.5–5.1)
Sodium: 130 mmol/L — ABNORMAL LOW (ref 135–145)
Total Bilirubin: 0.8 mg/dL (ref 0.3–1.2)
Total Protein: 8.7 g/dL — ABNORMAL HIGH (ref 6.5–8.1)

## 2022-03-16 LAB — URINALYSIS, ROUTINE W REFLEX MICROSCOPIC
Bilirubin Urine: NEGATIVE
Glucose, UA: >=500 mg/dL — AB
Ketones, ur: 20 mg/dL — AB
Leukocytes,Ua: NEGATIVE
Nitrite: NEGATIVE
Protein, ur: 30 mg/dL — AB
Specific Gravity, Urine: 1.035 — ABNORMAL HIGH (ref 1.005–1.030)
pH: 5 (ref 5.0–8.0)

## 2022-03-16 LAB — CBC
HCT: 35.6 % — ABNORMAL LOW (ref 36.0–46.0)
Hemoglobin: 10.6 g/dL — ABNORMAL LOW (ref 12.0–15.0)
MCH: 21.5 pg — ABNORMAL LOW (ref 26.0–34.0)
MCHC: 29.8 g/dL — ABNORMAL LOW (ref 30.0–36.0)
MCV: 72.4 fL — ABNORMAL LOW (ref 80.0–100.0)
Platelets: 337 10*3/uL (ref 150–400)
RBC: 4.92 MIL/uL (ref 3.87–5.11)
RDW: 18.1 % — ABNORMAL HIGH (ref 11.5–15.5)
WBC: 12.8 10*3/uL — ABNORMAL HIGH (ref 4.0–10.5)
nRBC: 0 % (ref 0.0–0.2)

## 2022-03-16 LAB — LIPASE, BLOOD: Lipase: 51 U/L (ref 11–51)

## 2022-03-16 LAB — POC URINE PREG, ED: Preg Test, Ur: NEGATIVE

## 2022-03-16 MED ORDER — MORPHINE SULFATE (PF) 4 MG/ML IV SOLN
4.0000 mg | Freq: Once | INTRAVENOUS | Status: AC
  Administered 2022-03-16: 4 mg via INTRAVENOUS
  Filled 2022-03-16: qty 1

## 2022-03-16 MED ORDER — CEFDINIR 300 MG PO CAPS: 300.0000 mg | ORAL_CAPSULE | Freq: Two times a day (BID) | ORAL | 0 refills | Status: AC

## 2022-03-16 MED ORDER — SODIUM CHLORIDE 0.9 % IV BOLUS
1000.0000 mL | Freq: Once | INTRAVENOUS | Status: AC
  Administered 2022-03-16: 1000 mL via INTRAVENOUS

## 2022-03-16 MED ORDER — IOHEXOL 300 MG/ML  SOLN
100.0000 mL | Freq: Once | INTRAMUSCULAR | Status: AC | PRN
  Administered 2022-03-16: 100 mL via INTRAVENOUS
  Filled 2022-03-16: qty 100

## 2022-03-16 MED ORDER — ONDANSETRON HCL 4 MG/2ML IJ SOLN
4.0000 mg | Freq: Once | INTRAMUSCULAR | Status: AC
  Administered 2022-03-16: 4 mg via INTRAVENOUS
  Filled 2022-03-16: qty 2

## 2022-03-16 MED ORDER — KETOROLAC TROMETHAMINE 15 MG/ML IJ SOLN
15.0000 mg | Freq: Once | INTRAMUSCULAR | Status: AC
  Administered 2022-03-16: 15 mg via INTRAVENOUS
  Filled 2022-03-16: qty 1

## 2022-03-16 MED ORDER — ONDANSETRON HCL 4 MG PO TABS: 4.0000 mg | ORAL_TABLET | Freq: Every day | ORAL | 1 refills | Status: AC | PRN

## 2022-03-16 MED ORDER — OXYCODONE HCL 5 MG PO TABS: 5.0000 mg | ORAL_TABLET | Freq: Three times a day (TID) | ORAL | 0 refills | Status: AC | PRN

## 2022-03-16 MED ORDER — SODIUM CHLORIDE 0.9 % IV SOLN
1.0000 g | Freq: Once | INTRAVENOUS | Status: AC
  Administered 2022-03-16: 1 g via INTRAVENOUS
  Filled 2022-03-16: qty 10

## 2022-03-16 NOTE — ED Provider Notes (Signed)
? ?[facility] ?Provider Note ? ? ? Event Date/Time  ? First MD Initiated Contact with Patient 03/16/22 1451   ?  (approximate) ? ? ?History  ? ?Emesis (/) ? ? ?HPI ? ?Tamara Tate is a 36 y.o. female with past medical history of diabetes, hyperlipidemia and seizure disorder presents with nausea and vomiting.  Patient notes that 3 days ago she developed pain in the right lower quadrant that radiates to her right lower back.  Since developing that she has developed diarrhea and nausea and vomiting.  Has about 3-4 episodes of nonbloody diarrhea per day as well as multiple of episodes of emesis inability to tolerate p.o.  Denies fevers but has had some night sweats.  Denies urinary symptoms.  Pain is in her right lower quadrant is intermittent radiates to the right lower back not upper back.  ?  ? ?Past Medical History:  ?Diagnosis Date  ? Anxiety   ? Diabetes mellitus without complication ([facility])   ? Hyperlipidemia   ? Seasonal allergies   ? Seizure ([facility])   ? ? ?Patient Active Problem List  ? Diagnosis Date Noted  ? Dizziness 07/26/2017  ? UTI (urinary tract infection) 07/26/2017  ? Pt desires tubal ligation 05/06/2017  ? Maternal obesity, antepartum 04/16/2017  ? History of cesarean section complicating pregnancy 04/15/2017  ? Pregnancy, supervision, high-risk 04/15/2017  ? Diabetes mellitus during pregnancy, antepartum 04/15/2017  ? Depression, recurrent ([facility]) 02/24/2017  ? Convulsions/seizures ([facility]) 09/10/2016  ? ? ? ?Physical Exam  ?Triage Vital Signs: ?ED Triage Vitals  ?Enc Vitals Group  ?   BP 03/16/22 1326 114/82  ?   Pulse Rate 03/16/22 1326 (!) 107  ?   Resp 03/16/22 1326 17  ?   Temp 03/16/22 1326 99.5 ?F (37.5 ?C)  ?   Temp Source 03/16/22 1326 Oral  ?   SpO2 03/16/22 1326 97 %  ?   Weight 03/16/22 1410 190 lb (86.2 kg)  ?   Height 03/16/22 1410 5\' 2"  (1.575 m)  ?   Head Circumference --   ?   Peak Flow --   ?   Pain Score 03/16/22 1410 10  ?   Pain Loc --   ?   Pain Edu? --   ?   Excl.  in GC? --   ? ? ?Most recent vital signs: ?Vitals:  ? 03/16/22 1326 03/16/22 1806  ?BP: 114/82 113/84  ?Pulse: (!) 107 84  ?Resp: 17 20  ?Temp: 99.5 ?F (37.5 ?C)   ?SpO2: 97% 100%  ? ? ? ?General: Awake, no distress.  ?CV:  Good peripheral perfusion.  ?Resp:  Normal effort.  ?Abd:  No distention.  Patient is tender to palpation in the right lower quadrant and right upper quadrant with voluntary guarding ?Neuro:             Awake, Alert, Oriented x 3  ?Other:  Dry MM ? ? ?ED Results / Procedures / Treatments  ?Labs ?(all labs ordered are listed, but only abnormal results are displayed) ?Labs Reviewed  ?COMPREHENSIVE METABOLIC PANEL - Abnormal; Notable for the following components:  ?    Result Value  ? Sodium 130 (*)   ? CO2 20 (*)   ? Glucose, Bld 366 (*)   ? Total Protein 8.7 (*)   ? All other components within normal limits  ?CBC - Abnormal; Notable for the following components:  ? WBC 12.8 (*)   ? Hemoglobin 10.6 (*)   ?  HCT 35.6 (*)   ? MCV 72.4 (*)   ? MCH 21.5 (*)   ? MCHC 29.8 (*)   ? RDW 18.1 (*)   ? All other components within normal limits  ?URINALYSIS, ROUTINE W REFLEX MICROSCOPIC - Abnormal; Notable for the following components:  ? Color, Urine YELLOW (*)   ? APPearance HAZY (*)   ? Specific Gravity, Urine 1.035 (*)   ? Glucose, UA >=500 (*)   ? Hgb urine dipstick MODERATE (*)   ? Ketones, ur 20 (*)   ? Protein, ur 30 (*)   ? Bacteria, UA MANY (*)   ? All other components within normal limits  ?URINE CULTURE  ?LIPASE, BLOOD  ?POC URINE PREG, ED  ? ? ? ?EKG ? ? ? ?RADIOLOGY ?I reviewed the CT abdomen pelvis which is notable for likely right-sided pyelonephritis ? ? ?PROCEDURES: ? ?Critical Care performed: No ? ?Procedures ? ?The patient is on the cardiac monitor to evaluate for evidence of arrhythmia and/or significant heart rate changes. ? ? ?MEDICATIONS ORDERED IN ED: ?Medications  ?sodium chloride 0.9 % bolus 1,000 mL (0 mLs Intravenous Stopped 03/16/22 1809)  ?sodium chloride 0.9 % bolus 1,000 mL (1,000  mLs Intravenous New Bag/Given 03/16/22 1807)  ?morphine (PF) 4 MG/ML injection 4 mg (4 mg Intravenous Given 03/16/22 1514)  ?ondansetron [facility](ZOFRAN) injection 4 mg (4 mg Intravenous Given 03/16/22 1512)  ?iohexol (OMNIPAQUE) 300 MG/ML solution 100 mL (100 mLs Intravenous Contrast Given 03/16/22 1526)  ?cefTRIAXone (ROCEPHIN) 1 g in sodium chloride 0.9 % 100 mL IVPB (0 g Intravenous Stopped 03/16/22 1809)  ?ketorolac (TORADOL) 15 MG/ML injection 15 mg (15 mg Intravenous Given 03/16/22 1807)  ? ? ? ?IMPRESSION / MDM / ASSESSMENT AND PLAN / ED COURSE  ?I reviewed the triage vital signs and the nursing notes. ?             ?               ? ?Differential diagnosis includes, but is not limited to, DKA, appendicitis, cholecystitis, colitis, gastroenteritis, pyelonephritis, nephrolithiasis ? ?The patient is a 36 year old female with type 2 diabetes on metformin who presents with nausea vomiting diarrhea right lower quadrant pain.  Symptoms started 3 days ago.  Her pain is intermittent is in the right lower quadrant rating to the right back without associated urinary symptoms.  Has had diarrhea and vomiting inability to tolerate p.o.  Her vital signs are notable for mild tachycardia temp of 99 5 but otherwise within normal limits.  She appears dry but nontoxic.  She is tender both in the right upper and right lower quadrant.  Her labs are notable for sugar of 366, bicarb is 20 but she has no anion gap.  Her lipase and LFTs are within normal limits.  I am concerned about her abdominal pain will obtain a CT abdomen pelvis with contrast.  She is pending a UA.  We will give her 2 L of fluids morphine and Zofran.  Labs are not consistent with DKA given no anion gap, suspect that the bicarb in the setting of diarrhea and dehydration. ? ?CT abdomen pelvis is consistent with right-sided Pyelo.  Patient's UA does have WBCs.  We will send urine culture.  She was given a dose of ceftriaxone and 2 L of fluid in the ED.  After morphine and Zofran  patient tolerated p.o. without difficulty both fluids and food.  Pain is well controlled.  I think given her vitals have normalized  she is tolerating p.o. and is now getting hydration I think she is appropriate for outpatient oral antibiotics.  Discussed return precautions for inability to tolerate p.o., worsening pain, or no improvement in several days.  ?Clinical Course as of 03/16/22 1817  ?Mon Mar 16, 2022  ?1526 Preg Test, Ur: Negative [KM]  ?  ?Clinical Course User Index ?[KM] Georga Hacking, MD  ? ? ? ?FINAL CLINICAL IMPRESSION(S) / ED DIAGNOSES  ? ?Final diagnoses:  ?Pyelonephritis  ? ? ? ?Rx / DC Orders  ? ?ED Discharge Orders   ? ?      Ordered  ?  cefdinir (OMNICEF) 300 MG capsule  2 times daily       ? 03/16/22 1816  ?  ondansetron (ZOFRAN) 4 MG tablet  Daily PRN       ? 03/16/22 1816  ?  oxyCODONE (ROXICODONE) 5 MG immediate release tablet  Every 8 hours PRN       ? 03/16/22 1816  ? ?  ?  ? ?  ? ? ? ?Note:  This document was prepared using Dragon voice recognition software and may include unintentional dictation errors. ?  ?Georga Hacking, MD ?03/16/22 1817 ? ?

## 2022-03-16 NOTE — ED Triage Notes (Signed)
Presents with some stomach pain with n/v/d since Saturday  subjective fever ?

## 2022-03-16 NOTE — ED Notes (Signed)
Pt arrives to the ED via POV and comes to the front desk and is speak with registration when the pt states she feels like she is going to pass out, pt noted to slowly lay herself to the floor with no injures noted at this time, pt placed in a wheelchair and VS obtained, VSS,  ?

## 2022-03-18 LAB — URINE CULTURE: Culture: >=100000 — AB

## 2022-06-02 ENCOUNTER — Encounter: Payer: Self-pay | Admitting: Certified Nurse Midwife

## 2022-07-02 ENCOUNTER — Other Ambulatory Visit: Payer: Self-pay | Admitting: Otolaryngology

## 2022-07-02 DIAGNOSIS — H7313 Chronic myringitis, bilateral: Secondary | ICD-10-CM

## 2022-07-16 ENCOUNTER — Ambulatory Visit
Admission: RE | Admit: 2022-07-16 | Discharge: 2022-07-16 | Disposition: A | Discharge status: Home / Self Care | Payer: Managed Care, Other (non HMO) | Source: Ambulatory Visit | Attending: Otolaryngology | Admitting: Otolaryngology

## 2022-07-16 DIAGNOSIS — H7313 Chronic myringitis, bilateral: Secondary | ICD-10-CM

## 2022-07-17 ENCOUNTER — Ambulatory Visit: Payer: Medicaid Other | Admitting: Internal Medicine

## 2022-08-12 ENCOUNTER — Ambulatory Visit: Payer: Managed Care, Other (non HMO) | Admitting: Nurse Practitioner

## 2022-09-18 ENCOUNTER — Ambulatory Visit: Payer: Managed Care, Other (non HMO) | Admitting: Nurse Practitioner

## 2023-06-04 ENCOUNTER — Telehealth: Payer: Self-pay | Admitting: *Deleted

## 2023-06-04 NOTE — Telephone Encounter (Signed)
Entered in error

## 2023-08-25 ENCOUNTER — Other Ambulatory Visit: Payer: Self-pay

## 2023-08-25 ENCOUNTER — Emergency Department: Payer: Managed Care, Other (non HMO)

## 2023-08-25 ENCOUNTER — Emergency Department
Admission: EM | Admit: 2023-08-25 | Discharge: 2023-08-25 | Disposition: A | Discharge status: Home / Self Care | Payer: Managed Care, Other (non HMO) | Attending: Emergency Medicine | Admitting: Emergency Medicine

## 2023-08-25 DIAGNOSIS — R002 Palpitations: Secondary | ICD-10-CM | POA: Insufficient documentation

## 2023-08-25 DIAGNOSIS — M25512 Pain in left shoulder: Secondary | ICD-10-CM | POA: Diagnosis not present

## 2023-08-25 DIAGNOSIS — E871 Hypo-osmolality and hyponatremia: Secondary | ICD-10-CM | POA: Diagnosis not present

## 2023-08-25 DIAGNOSIS — R0789 Other chest pain: Secondary | ICD-10-CM | POA: Insufficient documentation

## 2023-08-25 LAB — COMPREHENSIVE METABOLIC PANEL
ALT: 30 U/L (ref 0–44)
AST: 25 U/L (ref 15–41)
Albumin: 3.7 g/dL (ref 3.5–5.0)
Alkaline Phosphatase: 73 U/L (ref 38–126)
Anion gap: 8 (ref 5–15)
BUN: 13 mg/dL (ref 6–20)
CO2: 22 mmol/L (ref 22–32)
Calcium: 9 mg/dL (ref 8.9–10.3)
Chloride: 101 mmol/L (ref 98–111)
Creatinine, Ser: 0.54 mg/dL (ref 0.44–1.00)
GFR, Estimated: >60 mL/min (ref >60)
Glucose, Bld: 261 mg/dL — ABNORMAL HIGH (ref 70–99)
Potassium: 4 mmol/L (ref 3.5–5.1)
Sodium: 131 mmol/L — ABNORMAL LOW (ref 135–145)
Total Bilirubin: 0.3 mg/dL (ref 0.3–1.2)
Total Protein: 8.4 g/dL — ABNORMAL HIGH (ref 6.5–8.1)

## 2023-08-25 LAB — CBC
HCT: 36.2 % (ref 36.0–46.0)
Hemoglobin: 11.5 g/dL — ABNORMAL LOW (ref 12.0–15.0)
MCH: 25.5 pg — ABNORMAL LOW (ref 26.0–34.0)
MCHC: 31.8 g/dL (ref 30.0–36.0)
MCV: 80.3 fL (ref 80.0–100.0)
Platelets: 373 10*3/uL (ref 150–400)
RBC: 4.51 MIL/uL (ref 3.87–5.11)
RDW: 14.7 % (ref 11.5–15.5)
WBC: 6.8 10*3/uL (ref 4.0–10.5)
nRBC: 0 % (ref 0.0–0.2)

## 2023-08-25 LAB — LIPASE, BLOOD: Lipase: 58 U/L — ABNORMAL HIGH (ref 11–51)

## 2023-08-25 LAB — TROPONIN I (HIGH SENSITIVITY): Troponin I (High Sensitivity): 3 ng/L (ref <18)

## 2023-08-25 MED ORDER — KETOROLAC TROMETHAMINE 15 MG/ML IJ SOLN
15.0000 mg | Freq: Once | INTRAMUSCULAR | Status: AC
  Administered 2023-08-25: 15 mg via INTRAVENOUS
  Filled 2023-08-25: qty 1

## 2023-08-25 MED ORDER — LIDOCAINE 5 % EX PTCH
1.0000 | MEDICATED_PATCH | Freq: Two times a day (BID) | CUTANEOUS | 0 refills | Status: AC
Start: 2023-08-25 — End: 2023-08-30

## 2023-08-25 MED ORDER — LIDOCAINE 5 % EX PTCH
1.0000 | MEDICATED_PATCH | CUTANEOUS | Status: DC
  Administered 2023-08-25: 1 via TRANSDERMAL
  Filled 2023-08-25: qty 1

## 2023-08-25 NOTE — ED Triage Notes (Addendum)
Patient states chest pain and fatigue since last night.

## 2023-08-25 NOTE — ED Provider Notes (Signed)
[facility] Provider Note    Event Date/Time   First MD Initiated Contact with Patient 08/25/23 [id number]     (approximate)   History   Chest Pain   HPI  Tamara Tate is a 37 y.o. female with seizure on lamtrogine who is in with concern for chest pain. It has been off and on for 2 weeks. She felt pain left arm and left chest.  It was with palpitations. No prior history of MI. NO birth control. No SOB.  She reports the pain was at its worst around 7 PM last night and that it is actually gotten a little bit better but given it had not gone away today she wanted to be evaluated.  She reports pain in her left shoulder and feeling like her arm just does not feel right.  Physical Exam   Triage Vital Signs: ED Triage Vitals  Encounter Vitals Group     BP 08/25/23 0919 125/71     Systolic BP Percentile --      Diastolic BP Percentile --      Pulse Rate 08/25/23 0919 91     Resp 08/25/23 0919 20     Temp 08/25/23 0919 98.5 F (36.9 C)     Temp Source 08/25/23 0919 Oral     SpO2 08/25/23 0919 98 %     Weight 08/25/23 0917 189 lb (85.7 kg)     Height 08/25/23 0917 5\' 2"  (1.575 m)     Head Circumference --      Peak Flow --      Pain Score 08/25/23 0917 6     Pain Loc --      Pain Education --      Exclude from Growth Chart --     Most recent vital signs: Vitals:   08/25/23 0919  BP: 125/71  Pulse: 91  Resp: 20  Temp: 98.5 F (36.9 C)  SpO2: 98%     General: Awake, no distress.  CV:  Good peripheral perfusion.  Resp:  Normal effort.  Abd:  No distention.  Soft and nontender Other:  NIH stroke scale is 0.  No pronator drift equal grip strength no numbness, no tingling.  Good radial pulses good DP pulses She has tenderness over the left shoulder with discomfort with range of motion but able to fully range it.  No warmth no skin changes noted.  Able to flex extend the wrist.  Radial, ulnar, median nerve all intact distally.  ED Results / Procedures /  Treatments   Labs (all labs ordered are listed, but only abnormal results are displayed) Labs Reviewed  BASIC METABOLIC PANEL  CBC  POC URINE PREG, ED  TROPONIN I (HIGH SENSITIVITY)     EKG  My interpretation of EKG:  Normal sinus rhythm 92 without any ST elevation or T wave inversions, normal intervals  RADIOLOGY I have reviewed the xray personally and interpreted and no evidence of any pneumonia   PROCEDURES:  Critical Care performed: No  .1-3 Lead EKG Interpretation  Performed by: Concha Se, MD Authorized by: Concha Se, MD     Interpretation: normal     ECG rate:  80   ECG rate assessment: normal     Rhythm: sinus rhythm     Ectopy: none     Conduction: normal      MEDICATIONS ORDERED IN ED: Medications  lidocaine (LIDODERM) 5 % 1 patch (has no administration in time range)  ketorolac (TORADOL)  15 MG/ML injection 15 mg (has no administration in time range)     IMPRESSION / MDM / ASSESSMENT AND PLAN / ED COURSE  I reviewed the triage vital signs and the nursing notes.   Patient's presentation is most consistent with acute presentation with potential threat to life or bodily function.   Given the discomfort in her left shoulder with ranging and with palpation I suspect this is most likely musculoskeletal in nature.  She is neurovascularly intact.  No signs of any stroke.  Given chest pain has was mostly last night will only need 1 troponin.  Normal blood pressures well-appearing good distal pulses no numbness, no tingling doubt dissection.  She denies any risk factors for pulmonary embolism.  She is PERC negative.  Troponin was negative CBC shows hemoglobin is around baseline.  CMP shows slightly low sodium similar to prior glucose slightly elevated which I did discuss with patient.  Lipase was slightly elevated but she is nontender this is most likely incidental  Discussed with patient reassuring workup and my examination I suspect most likely  musculoskeletal in nature will trial some Toradol, lidocaine patch.  We discussed return precautions in regards to her chest pain and her arm pain.  She expressed understanding.  I will refer her to cardiology given she did report some palpitations to discuss possible Holter monitor.  12:27 PM on repeat evaluation patient reports she is now pain-free she denies any weakness, other symptoms.  She reports feeling at her baseline self.  I considered admission but given resolution of symptoms low heart score and negative troponin patient can follow-up outpatient with cardiology but I suspect at this time this is most likely musculoskeletal in nature.  The patient is on the cardiac monitor to evaluate for evidence of arrhythmia and/or significant heart rate changes.      FINAL CLINICAL IMPRESSION(S) / ED DIAGNOSES   Final diagnoses:  Acute pain of left shoulder  Atypical chest pain  Palpitations     Rx / DC Orders   ED Discharge Orders          Ordered    Ambulatory referral to Cardiology        08/25/23 1226    lidocaine (LIDODERM) 5 %  Every 12 hours        08/25/23 1227             Note:  This document was prepared using Dragon voice recognition software and may include unintentional dictation errors.   Concha Se, MD 08/25/23 1228

## 2023-08-25 NOTE — ED Notes (Signed)
PA student at bedside.

## 2023-08-25 NOTE — Discharge Instructions (Signed)
Take ibuprofen 600 every 6-8 hours with food with the lidocaine patches and return to the ER if develop new weakness, numbness or any other concerns

## 2023-10-21 ENCOUNTER — Telehealth: Payer: Self-pay

## 2023-10-21 NOTE — Telephone Encounter (Signed)
The patient is scheduled to see Debbe Odea, MD on 10-29-2023 as a new patient. Called and left a voice message to inquire about any previous cardiac history and to verify her PCP.  Requested the patient to call back at their earliest convenience.

## 2023-10-29 ENCOUNTER — Ambulatory Visit: Payer: Managed Care, Other (non HMO) | Admitting: Cardiology

## 2023-12-20 ENCOUNTER — Ambulatory Visit: Payer: Managed Care, Other (non HMO) | Admitting: Cardiology

## 2024-03-22 IMAGING — CT CT ABD-PELV W/ CM
2 of 4 series · 16 of 46 positions shown, 18 images · IV contrast (APPLIED)
Comparison: 05/12/2012

CLINICAL DATA: Right lower quadrant abdominal pain, stomach pain,
nausea, vomiting, diarrhea since [REDACTED]

EXAM:
CT ABDOMEN AND PELVIS WITH CONTRAST
TECHNIQUE: Multidetector CT imaging of the abdomen and pelvis was performed
using the standard protocol following bolus administration of
intravenous contrast.

[Series 2: routine abd/pel with · axial · 0.83mm/px · z∈[-530,-95]mm · 13 of 95 slices shown, 15 images]
[im 4/95  soft-tissue]
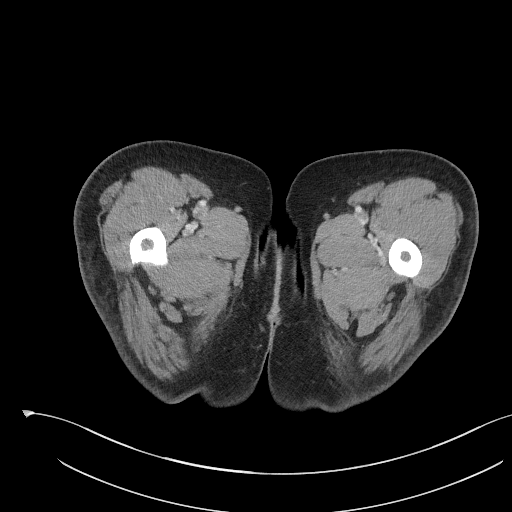
[im 4/95  bone]
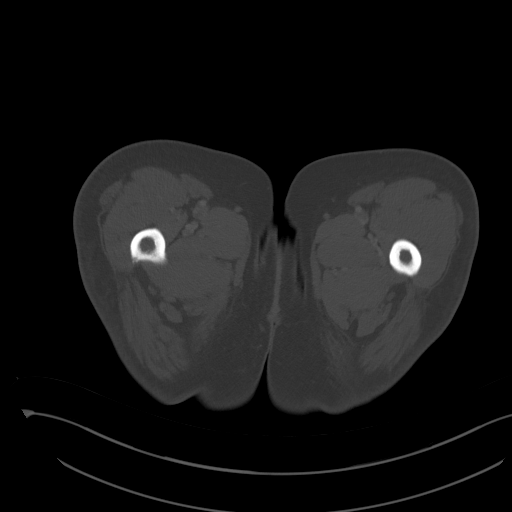
[im 12/95  soft-tissue]
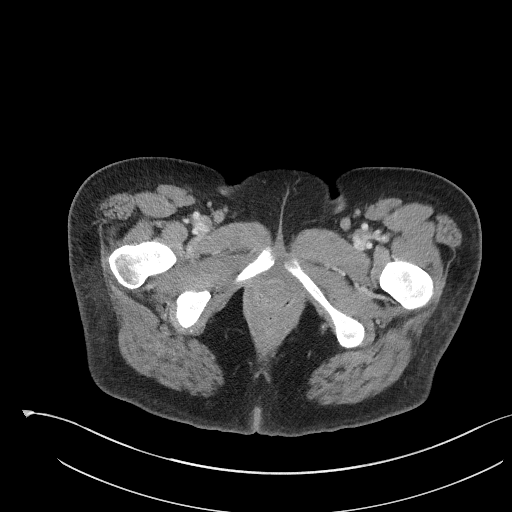
[im 20/95  soft-tissue]
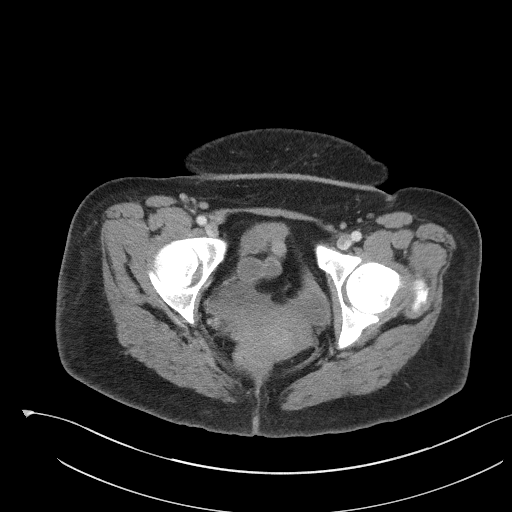
[im 28/95  soft-tissue]
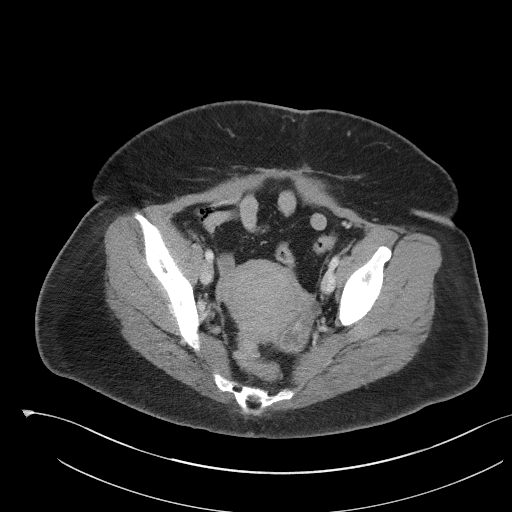
[im 32/95  soft-tissue]
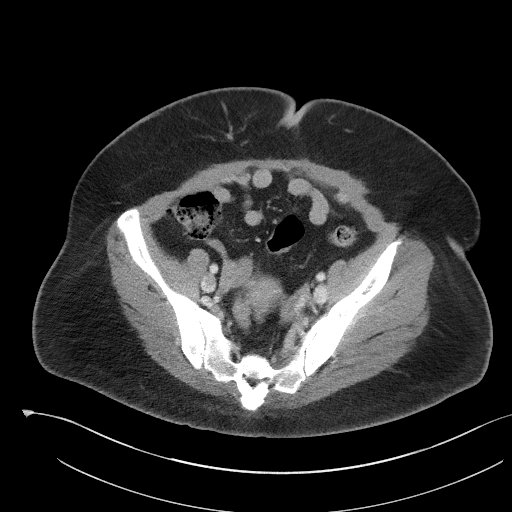
[im 40/95  soft-tissue]
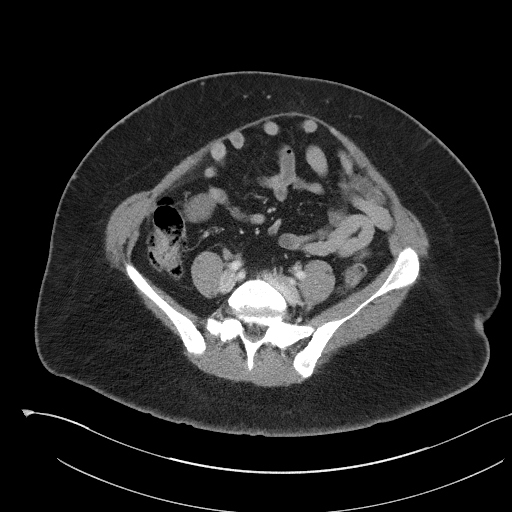
[im 48/95  soft-tissue]
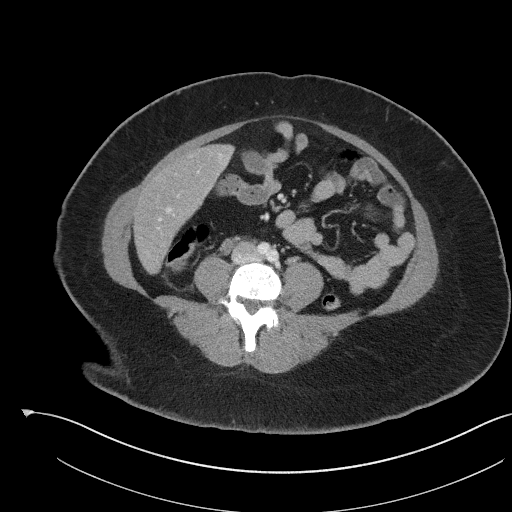
[im 55/95  soft-tissue]
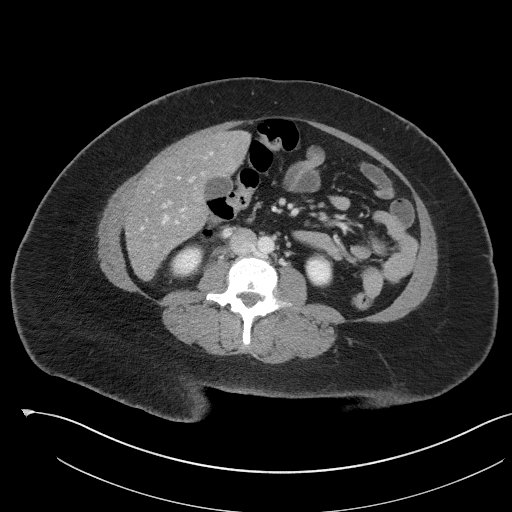
[im 63/95  soft-tissue]
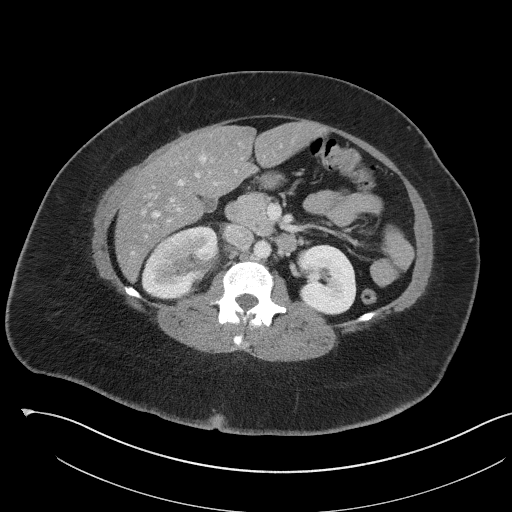
[im 63/95  bone]
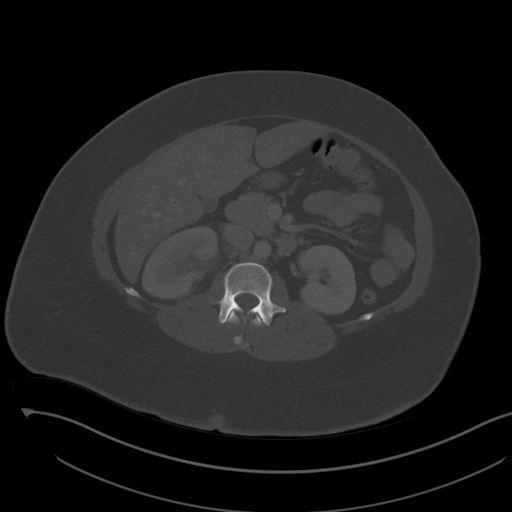
[im 67/95  soft-tissue]
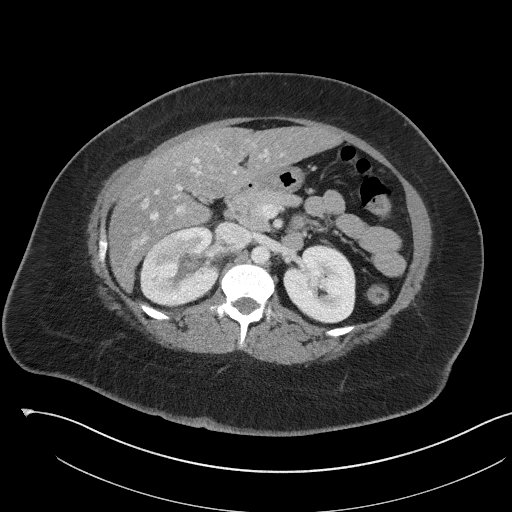
[im 75/95  soft-tissue]
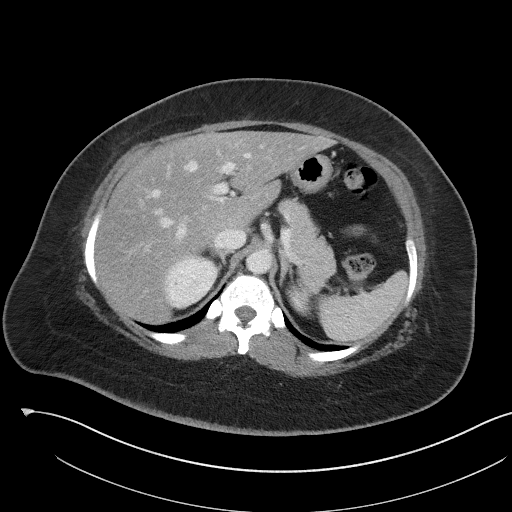
[im 83/95  soft-tissue]
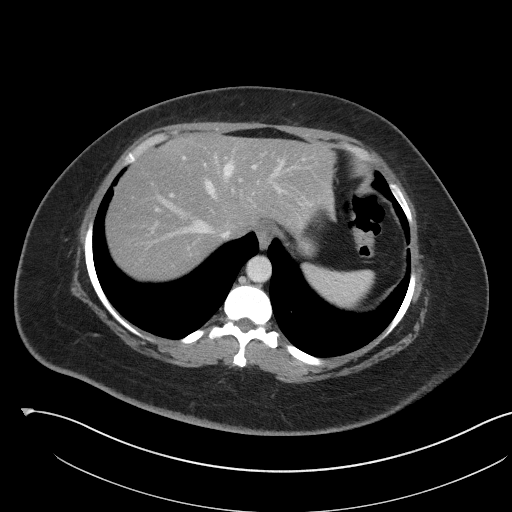
[im 91/95  soft-tissue]
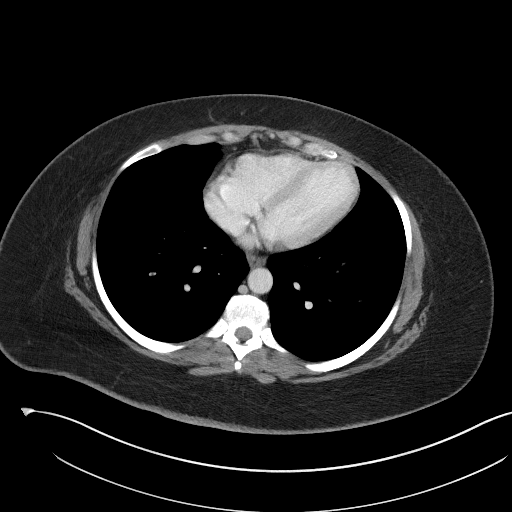

[Series 5: coronal st · coronal · 0.83mm/px · 3 of 100 slices shown]
[im 34/100  soft-tissue]
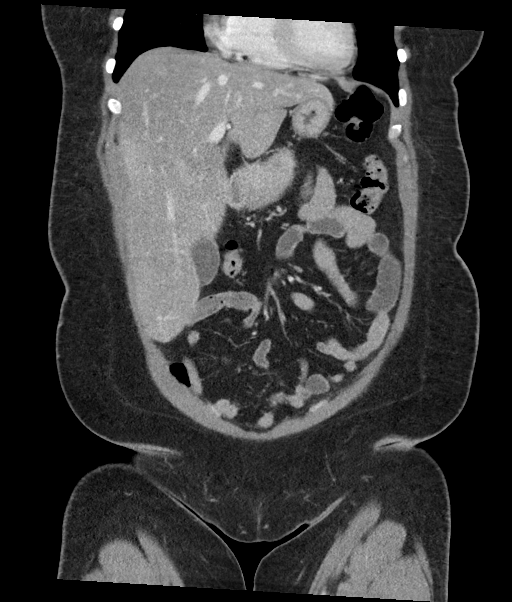
[im 45/100  soft-tissue]
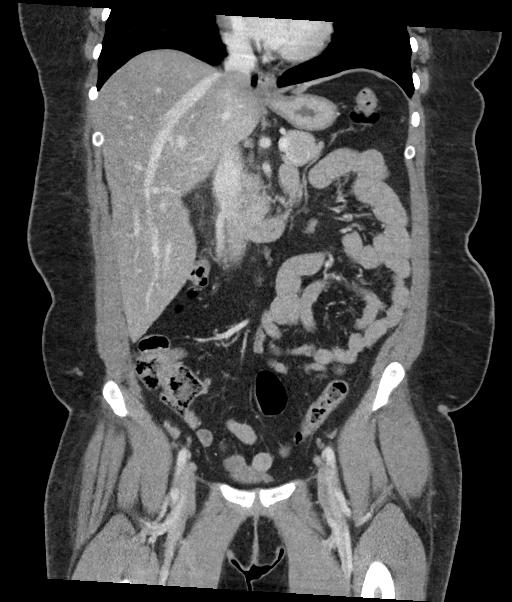
[im 56/100  soft-tissue]
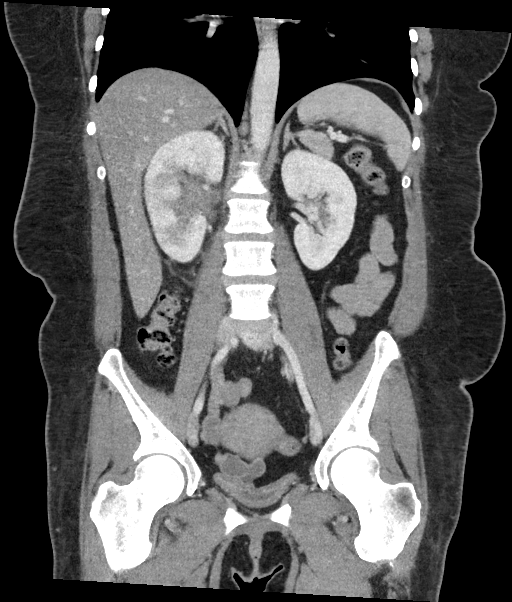

[16 of 46 positions shown; findings below may reference images not displayed]

RADIATION DOSE REDUCTION: This exam was performed according to the
departmental dose-optimization program which includes automated
exposure control, adjustment of the mA and/or kV according to
patient size and/or use of iterative reconstruction technique.

CONTRAST:  100mL OMNIPAQUE IOHEXOL 300 MG/ML  SOLN
FINDINGS: Lower chest: No focal pulmonary opacity. No pleural or pericardial
effusion.

Hepatobiliary: Hepatic steatosis. No focal hepatic lesion. The
hepatic and portal veins are patent. No intra or extrahepatic
biliary ductal dilatation. The gallbladder is unremarkable.

Pancreas: Unremarkable. No pancreatic ductal dilatation or
surrounding inflammatory changes.

Spleen: Normal in size without focal abnormality.

Adrenals/Urinary Tract: The adrenal glands are unremarkable. The
kidneys are symmetric in size. Areas of hypoenhancement in the right
kidney (series 5, images 53, 58, and 59 and series 2, images 29-33).
The left kidney enhances normally. There is some fat stranding about
the right renal pelvis and proximal right ureter. The bladder is
unremarkable for degree of decompression.

Stomach/Bowel: Stomach is within normal limits. Appendix appears
normal. No evidence of bowel wall thickening, distention, or
inflammatory changes.

Vascular/Lymphatic: No significant vascular findings are present. No
enlarged abdominal or pelvic lymph nodes.

Reproductive: Corpus luteum cyst in the left ovary. Trace free fluid
in the pelvis, likely physiologic. The uterus and bilateral adnexa
are otherwise unremarkable.

Other: Small fat containing umbilical hernia. Trace free fluid in
the pelvis, likely physiologic. No free air.

Musculoskeletal: No acute osseous abnormality.
IMPRESSION: 1. Areas of hypoenhancement in the right kidney, with fat stranding
about the right renal pelvis and proximal right ureter, overall
concerning for pyelonephritis. Correlate with UA.
2. Hepatic steatosis.

## 2024-08-07 ENCOUNTER — Other Ambulatory Visit: Payer: Self-pay

## 2024-08-07 ENCOUNTER — Encounter: Payer: Self-pay | Admitting: Emergency Medicine

## 2024-08-07 ENCOUNTER — Emergency Department

## 2024-08-07 ENCOUNTER — Emergency Department
Admission: EM | Admit: 2024-08-07 | Discharge: 2024-08-07 | Disposition: A | Discharge status: Home / Self Care | Attending: Emergency Medicine | Admitting: Emergency Medicine

## 2024-08-07 DIAGNOSIS — E1165 Type 2 diabetes mellitus with hyperglycemia: Secondary | ICD-10-CM | POA: Diagnosis not present

## 2024-08-07 DIAGNOSIS — R079 Chest pain, unspecified: Secondary | ICD-10-CM | POA: Diagnosis present

## 2024-08-07 DIAGNOSIS — R42 Dizziness and giddiness: Secondary | ICD-10-CM

## 2024-08-07 LAB — TROPONIN I (HIGH SENSITIVITY)
Troponin I (High Sensitivity): <2 ng/L (ref <18)
Troponin I (High Sensitivity): 3 ng/L (ref <18)

## 2024-08-07 LAB — CBC
HCT: 40.8 % (ref 36.0–46.0)
Hemoglobin: 13.1 g/dL (ref 12.0–15.0)
MCH: 25.7 pg — ABNORMAL LOW (ref 26.0–34.0)
MCHC: 32.1 g/dL (ref 30.0–36.0)
MCV: 80.2 fL (ref 80.0–100.0)
Platelets: 343 K/uL (ref 150–400)
RBC: 5.09 MIL/uL (ref 3.87–5.11)
RDW: 14.5 % (ref 11.5–15.5)
WBC: 9.5 K/uL (ref 4.0–10.5)
nRBC: 0 % (ref 0.0–0.2)

## 2024-08-07 LAB — BASIC METABOLIC PANEL WITH GFR
Anion gap: 10 (ref 5–15)
BUN: 12 mg/dL (ref 6–20)
CO2: 25 mmol/L (ref 22–32)
Calcium: 9.7 mg/dL (ref 8.9–10.3)
Chloride: 96 mmol/L — ABNORMAL LOW (ref 98–111)
Creatinine, Ser: 0.75 mg/dL (ref 0.44–1.00)
GFR, Estimated: >60 mL/min (ref >60)
Glucose, Bld: 397 mg/dL — ABNORMAL HIGH (ref 70–99)
Potassium: 3.8 mmol/L (ref 3.5–5.1)
Sodium: 131 mmol/L — ABNORMAL LOW (ref 135–145)

## 2024-08-07 NOTE — ED Triage Notes (Signed)
 Patient to ED via POV for generalized CP that radiates to left arm. Started this AM. States she feels sweaty and dizzy. Denies cardiac hx. Reports starting her period today.

## 2024-08-07 NOTE — ED Notes (Signed)
 Called phlebotomist to come collect 2nd troponin.

## 2024-08-07 NOTE — ED Notes (Signed)
 NP in room now.

## 2024-08-07 NOTE — ED Provider Notes (Signed)
 [facility] Provider Note    Event Date/Time   First MD Initiated Contact with Patient 08/07/24 1803     (approximate)   History   Chest Pain   HPI  Tamara Tate is a 38 y.o. female with history of diabetes, hyperlipidemia, seizures and as listed in EMR presents to the emergency department for treatment and evaluation of generalized chest pain radiating to the left arm that started this morning.  She has also felt dizzy with position change.  Chest pain has resolved but she still has some tingling in the left arm.  She has experienced the symptoms in the past and was evaluated without definitive cause.  She has taken Tylenol  today due to menstrual pain but does not feel it helped with the chest pain.  She denies shortness of breath.  No personal cardiac history.  Mother passed away in her 33s of heart attack and diabetes.     Physical Exam    Vitals:   08/07/24 1617  BP: (!) 157/90  Pulse: 89  Resp: 18  Temp: 98.5 F (36.9 C)  SpO2: 96%    General: Awake, no distress.  CV:  Good peripheral perfusion.  Resp:  Normal effort.  Abd:  No distention.  Other:     ED Results / Procedures / Treatments   Labs (all labs ordered are listed, but only abnormal results are displayed)  Labs Reviewed  BASIC METABOLIC PANEL WITH GFR - Abnormal; Notable for the following components:      Result Value   Sodium 131 (*)    Chloride 96 (*)    Glucose, Bld 397 (*)    All other components within normal limits  CBC - Abnormal; Notable for the following components:   MCH 25.7 (*)    All other components within normal limits  POC URINE PREG, ED  TROPONIN I (HIGH SENSITIVITY)  TROPONIN I (HIGH SENSITIVITY)     EKG  Normal sinus rhythm with a rate of 90.   RADIOLOGY  Image and radiology report reviewed and interpreted by me. Radiology report consistent with the same.  Chest x-ray shows no acute cardiopulmonary abnormality.  PROCEDURES:  Critical  Care performed: No  Procedures   MEDICATIONS ORDERED IN ED:  Medications - No data to display   IMPRESSION / MDM / ASSESSMENT AND PLAN / ED COURSE   I have reviewed the triage note and vital signs. Vital signs stable.   Differential diagnosis includes, but is not limited to, cardiac event, anxiety, hyper/hypoglycemia, musculoskeletal pain.  Patient's presentation is most consistent with acute presentation with potential threat to life or bodily function.  38 year old female presenting to the emergency department for treatment and evaluation of chest pain with radiation into the left arm, and dizziness.   She has had the symptoms in the past but has never had a clear diagnosis.  EKG, chest x-ray, serial troponins, CBC are all reassuring.  Her glucose is elevated at 397.  Patient states that she does not take her medication because she does not like how the metformin  makes her feel and she does not want to do the injections.  She was advised that this is likely the cause of her symptoms and she needs to follow-up with primary care to discuss management options.  In the meantime, diet control strongly recommended.  Primary care and cardiology referral requested upon discharge.  ER return precautions discussed.  Patient discharged home.      FINAL CLINICAL IMPRESSION(S) /  ED DIAGNOSES   Final diagnoses:  Nonspecific chest pain  Dizziness  Uncontrolled type 2 diabetes mellitus with hyperglycemia (HCC)     Rx / DC Orders   ED Discharge Orders          Ordered    Ambulatory referral to Cardiology       Comments: If you have not heard from the Cardiology office within the next 72 hours please call [phone].   08/07/24 1905    Ambulatory Referral to Primary Care (Establish Care)       Comments: Uncontrolled diabetes.   08/07/24 1913             Note:  This document was prepared using Dragon voice recognition software and may include unintentional dictation errors.    Herlinda Kirk NOVAK, FNP 08/07/24 1916    Arlander Charleston, MD 08/07/24 2014

## 2024-08-07 NOTE — Discharge Instructions (Signed)
 Your cardiac workup today is reassuring.   Call and schedule follow-up appointment with cardiology.  Return to the emergency department if symptoms change or worsen

## 2024-09-01 ENCOUNTER — Encounter: Payer: Self-pay | Admitting: Cardiology

## 2024-09-01 ENCOUNTER — Ambulatory Visit (INDEPENDENT_AMBULATORY_CARE_PROVIDER_SITE_OTHER): Admitting: Cardiology

## 2024-09-01 VITALS — BP 126/72 | HR 84 | Ht 62.0 in | Wt 180.2 lb

## 2024-09-01 DIAGNOSIS — Z1329 Encounter for screening for other suspected endocrine disorder: Secondary | ICD-10-CM

## 2024-09-01 DIAGNOSIS — F339 Major depressive disorder, recurrent, unspecified: Secondary | ICD-10-CM | POA: Diagnosis not present

## 2024-09-01 DIAGNOSIS — F419 Anxiety disorder, unspecified: Secondary | ICD-10-CM | POA: Insufficient documentation

## 2024-09-01 DIAGNOSIS — E782 Mixed hyperlipidemia: Secondary | ICD-10-CM

## 2024-09-01 DIAGNOSIS — E66811 Obesity, class 1: Secondary | ICD-10-CM

## 2024-09-01 DIAGNOSIS — E1165 Type 2 diabetes mellitus with hyperglycemia: Secondary | ICD-10-CM | POA: Insufficient documentation

## 2024-09-01 DIAGNOSIS — E6609 Other obesity due to excess calories: Secondary | ICD-10-CM | POA: Insufficient documentation

## 2024-09-01 DIAGNOSIS — Z13220 Encounter for screening for lipoid disorders: Secondary | ICD-10-CM

## 2024-09-01 DIAGNOSIS — Z131 Encounter for screening for diabetes mellitus: Secondary | ICD-10-CM

## 2024-09-01 DIAGNOSIS — Z6832 Body mass index (BMI) 32.0-32.9, adult: Secondary | ICD-10-CM

## 2024-09-01 DIAGNOSIS — Z7689 Persons encountering health services in other specified circumstances: Secondary | ICD-10-CM

## 2024-09-01 DIAGNOSIS — E785 Hyperlipidemia, unspecified: Secondary | ICD-10-CM | POA: Insufficient documentation

## 2024-09-01 MED ORDER — METFORMIN HCL ER 500 MG PO TB24: 500.0000 mg | ORAL_TABLET | Freq: Every day | ORAL | 0 refills | Status: DC

## 2024-09-01 MED ORDER — HYDROXYZINE HCL 10 MG PO TABS: 10.0000 mg | ORAL_TABLET | Freq: Three times a day (TID) | ORAL | 5 refills | Status: DC | PRN

## 2024-09-01 NOTE — Progress Notes (Signed)
 New Patient Office Visit  Subjective   Patient ID: Tamara Tate, female    DOB: 24-Jun-1986  Age: 38 y.o. MRN: [id number]  CC:  Chief Complaint  Patient presents with   Establish Care    NPE    HPI Tamara Tate presents to establish care Previous Primary Care provider/office:   she does not have additional concerns to discuss today.   Patient in office to establish care. Patient doing well. Complains of increased anxiety, depression. Patient reports losing her mother in November, reports she gets more depressed this time of year. Discussed options, patient prefers a PRN medication for anxiety, will send in hydroxyzine.  Patient has a history of diabetes. Has not taken any medication for a few years. Will get lab work today. Will start metformin .  Blood pressure controlled today.  Kidney function normal during recent ED visit.     Outpatient Encounter Medications as of 09/01/2024  Medication Sig   ACCU-CHEK FASTCLIX LANCETS MISC 1 Units by Percutaneous route 4 (four) times daily.   Ashwagandha (ASHWAGANDHA 35) 120 MG CAPS Take 1,300 mg by mouth daily.   Blood Glucose Monitoring Suppl (ACCU-CHEK NANO SMARTVIEW) w/Device KIT 1 kit by Subdermal route as directed. Check blood sugars for fasting, and two hours after breakfast, lunch and dinner (4 checks daily)   fluticasone (FLONASE) 50 MCG/ACT nasal spray Place 2 sprays into both nostrils daily.   glucose blood (ACCU-CHEK SMARTVIEW) test strip Use as instructed to check blood sugars   hydrOXYzine (ATARAX) 10 MG tablet Take 1 tablet (10 mg total) by mouth 3 (three) times daily as needed.   Multiple Vitamin (MULTIVITAMIN) tablet Take 1 tablet by mouth daily.   insulin  aspart (NOVOLOG ) 100 UNIT/ML FlexPen 7 units SQ with breakfast, 7 units SQ with dinner (Patient not taking: Reported on 09/01/2024)   Insulin  Detemir (LEVEMIR  FLEXPEN) 100 UNIT/ML Pen 16 units SQ before breakfast 8 units SQ before dinner (Patient not taking: Reported on  09/01/2024)   lamoTRIgine  (LAMICTAL ) 200 MG tablet Take 1 tablet (200 mg total) by mouth 2 (two) times daily. (Patient not taking: Reported on 09/01/2024)   metFORMIN  (GLUCOPHAGE  XR) 500 MG 24 hr tablet Take 1 tablet (500 mg total) by mouth daily with breakfast.   [DISCONTINUED] metFORMIN  (GLUCOPHAGE  XR) 500 MG 24 hr tablet Take 1 tablet (500 mg total) by mouth daily with breakfast. (Patient not taking: Reported on 09/01/2024)   No facility-administered encounter medications on file as of 09/01/2024.    Past Medical History:  Diagnosis Date   Anxiety    Convulsions/seizures (HCC) 09/10/2016   Pt was on lamictal  briefly   She had a normal EEG and MRI per report   At [facility]     Diabetes mellitus during pregnancy, antepartum 04/15/2017   Current Diabetic Medications:  Insulin  (metformin  at start of pregnancy)   [ ]  Aspirin 81 mg daily after 12 weeks     Required Referrals for A1GDM or A2GDM:  [X]  Diabetes Education and Testing Supplies  [ ]  Nutrition Consult     For A2/B GDM or higher classes of DM  [ ]  Diabetes Education and Testing Supplies  [ ]  Nutrition Counsult  [ ]  Fetal ECHO after 20 weeks   [ ]  Eye exam for retina evaluati   Diabetes mellitus without complication (HCC)    Dizziness 07/26/2017   History of cesarean section complicating pregnancy 04/15/2017   X 2 at [facility] - first for FHR decels in labor   Second - pt  had an abnormal NST and was transferred emergently for cesarean  Both infants had septal hypertrophy requiring NICU stay      Hyperlipidemia    Maternal obesity, antepartum 04/16/2017   Pregnancy, supervision, high-risk 04/15/2017   Clinic    [facility]    Prenatal Labs      Dating         Blood type:   B pos      Genetic Screen    1 Screen: desires   AFP: recommended given DMII     Quad:        Antibody: negative      Anatomic US          Rubella:  Non-immune Varicella:Immune       GTT    N/A DMII    RPR:   NR      Rhogam    N/A    HBsAg:   neg      TDaP vaccine                           Flu Shot:    HIV:   neg      Baby Food     Pt desires tubal ligation 05/06/2017   With her cesaeran     Seasonal allergies    Seizure (HCC)    UTI (urinary tract infection) 07/26/2017    Past Surgical History:  Procedure Laterality Date   CESAREAN SECTION     x 2   INNER EAR SURGERY      Family History  Problem Relation Age of Onset   Diabetes Mother    Hypertension Mother    Asthma Father    Diabetes Maternal Grandmother    Cancer Maternal Grandfather        Leukemia   Cancer Paternal Grandfather        Prostate   Breast cancer Maternal Aunt     Social History   Socioeconomic History   Marital status: Married    Spouse name: Not on file   Number of children: 2   Years of education: BS   Highest education level: Not on file  Occupational History   Occupation: Programme researcher, broadcasting[employer]  Tobacco Use   Smoking status: Never   Smokeless tobacco: Never  Substance and Sexual Activity   Alcohol use: Yes    Comment: 1 drink per week   Drug use: No   Sexual activity: Yes    Partners: Male    Birth control/protection: None    Comment: Married  Other Topics Concern   Not on file  Social History Narrative   Lives at home w/ her husband and 2 children   Right-handed   Caffeine: 1 soda per day   Social Drivers of Corporate investment banker Strain: Not on file  Food Insecurity: Not on file  Transportation Needs: Not on file  Physical Activity: Not on file  Stress: Not on file  Social Connections: Not on file  Intimate Partner Violence: Not on file    Review of Systems  Constitutional: Negative.   HENT: Negative.    Eyes: Negative.   Respiratory: Negative.  Negative for shortness of breath.   Cardiovascular: Negative.  Negative for chest pain.  Gastrointestinal: Negative.  Negative for abdominal pain, constipation and diarrhea.  Genitourinary: Negative.   Musculoskeletal:  Negative for joint pain and myalgias.  Skin: Negative.    Neurological: Negative.  Negative for dizziness and headaches.  Endo/Heme/Allergies: Negative.   Psychiatric/Behavioral:  Positive for depression. The patient is nervous/anxious.   All other systems reviewed and are negative.       Objective   BP 126/72   Pulse 84   Ht 5' 2 (1.575 m)   Wt 180 lb 3.2 oz (81.7 kg)   LMP 08/07/2024   SpO2 98%   BMI 32.96 kg/m   Physical Exam Vitals and nursing note reviewed.  Constitutional:      Appearance: Normal appearance. She is normal weight.  HENT:     Head: Normocephalic and atraumatic.     Nose: Nose normal.     Mouth/Throat:     Mouth: Mucous membranes are moist.  Eyes:     Extraocular Movements: Extraocular movements intact.     Conjunctiva/sclera: Conjunctivae normal.     Pupils: Pupils are equal, round, and reactive to light.  Cardiovascular:     Rate and Rhythm: Normal rate and regular rhythm.     Pulses: Normal pulses.     Heart sounds: Normal heart sounds.  Pulmonary:     Effort: Pulmonary effort is normal.     Breath sounds: Normal breath sounds.  Abdominal:     General: Abdomen is flat. Bowel sounds are normal.     Palpations: Abdomen is soft.  Musculoskeletal:        General: Normal range of motion.     Cervical back: Normal range of motion.  Skin:    General: Skin is warm and dry.  Neurological:     General: No focal deficit present.     Mental Status: She is alert and oriented to person, place, and time.  Psychiatric:        Mood and Affect: Mood normal.        Behavior: Behavior normal.        Thought Content: Thought content normal.        Judgment: Judgment normal.       09/01/2024   10:35 AM 02/24/2017   11:51 AM  GAD 7 : Generalized Anxiety Score  Nervous, Anxious, on Edge 1 3  Control/stop worrying 1 3  Worry too much - different things 1 3  Trouble relaxing 1 2  Restless 0 1  Easily annoyed or irritable 1 1  Afraid - awful might happen 0 1  Total GAD 7 Score 5 14  Anxiety Difficulty Not  difficult at all Somewhat difficult    Flowsheet Row Office Visit from 09/01/2024 in [facility] Office Visit from 02/24/2017 in [facility]  Thoughts that you would be better off dead, or of hurting yourself in some way -- Not at all  PHQ-9 Total Score 4 14       Assessment & Plan:  Hydroxyzine Metformin  Lab work today  Problem List Items Addressed This Visit       Endocrine   Type 2 diabetes mellitus with hyperglycemia, without long-term current use of insulin  (HCC) - Primary   Relevant Medications   metFORMIN  (GLUCOPHAGE  XR) 500 MG 24 hr tablet     Other   Depression, recurrent   Relevant Medications   hydrOXYzine (ATARAX) 10 MG tablet   Encounter to establish care   Hyperlipidemia   Anxiety   Relevant Medications   hydrOXYzine (ATARAX) 10 MG tablet   Class 1 obesity due to excess calories with serious comorbidity and body mass index (BMI) of 32.0 to 32.9 in adult   Relevant Medications   metFORMIN  (GLUCOPHAGE  XR)  500 MG 24 hr tablet   Other Visit Diagnoses       Diabetes mellitus screening       Relevant Orders   CMP14+EGFR   Hemoglobin A1c     Thyroid disorder screening       Relevant Orders   TSH     Lipid screening       Relevant Orders   Lipid Profile       Return in about 2 weeks (around 09/15/2024).   Total time spent: 25 minutes. This time includes review of previous notes and results and patient face to face interaction during today's visit.    Jeoffrey Pollen, NP  09/01/2024   This document may have been prepared by Dragon Voice Recognition software and as such may include unintentional dictation errors.

## 2024-09-04 ENCOUNTER — Ambulatory Visit: Payer: Self-pay | Admitting: Cardiology

## 2024-09-04 LAB — CMP14+EGFR
ALT: 28 IU/L (ref 0–32)
AST: 20 IU/L (ref 0–40)
Albumin: 4 g/dL (ref 3.9–4.9)
Alkaline Phosphatase: 72 IU/L (ref 41–116)
BUN/Creatinine Ratio: 19 (ref 9–23)
BUN: 15 mg/dL (ref 6–20)
Bilirubin Total: 0.3 mg/dL (ref 0.0–1.2)
CO2: 21 mmol/L (ref 20–29)
Calcium: 9.4 mg/dL (ref 8.7–10.2)
Chloride: 97 mmol/L (ref 96–106)
Creatinine, Ser: 0.77 mg/dL (ref 0.57–1.00)
Globulin, Total: 3.4 g/dL (ref 1.5–4.5)
Glucose: 263 mg/dL — ABNORMAL HIGH (ref 70–99)
Potassium: 4.1 mmol/L (ref 3.5–5.2)
Sodium: 131 mmol/L — ABNORMAL LOW (ref 134–144)
Total Protein: 7.4 g/dL (ref 6.0–8.5)
eGFR: 101 mL/min/1.73 (ref >59)

## 2024-09-04 LAB — LIPID PANEL
Chol/HDL Ratio: 4.5 ratio — ABNORMAL HIGH (ref 0.0–4.4)
Cholesterol, Total: 295 mg/dL — ABNORMAL HIGH (ref 100–199)
HDL: 65 mg/dL (ref >39)
LDL Chol Calc (NIH): 188 mg/dL — ABNORMAL HIGH (ref 0–99)
Triglycerides: 220 mg/dL — ABNORMAL HIGH (ref 0–149)
VLDL Cholesterol Cal: 42 mg/dL — ABNORMAL HIGH (ref 5–40)

## 2024-09-04 LAB — TSH: TSH: 1.27 u[IU]/mL (ref 0.450–4.500)

## 2024-09-04 LAB — HEMOGLOBIN A1C
Est. average glucose Bld gHb Est-mCnc: 369 mg/dL
Hgb A1c MFr Bld: 14.5 % — ABNORMAL HIGH (ref 4.8–5.6)

## 2024-09-15 ENCOUNTER — Encounter: Payer: Self-pay | Admitting: Cardiology

## 2024-09-15 ENCOUNTER — Ambulatory Visit: Admitting: Cardiology

## 2024-09-15 VITALS — BP 118/72 | HR 97 | Ht 62.0 in | Wt 181.6 lb

## 2024-09-15 DIAGNOSIS — E1165 Type 2 diabetes mellitus with hyperglycemia: Secondary | ICD-10-CM

## 2024-09-15 DIAGNOSIS — E782 Mixed hyperlipidemia: Secondary | ICD-10-CM

## 2024-09-15 DIAGNOSIS — Z23 Encounter for immunization: Secondary | ICD-10-CM | POA: Diagnosis not present

## 2024-09-15 DIAGNOSIS — Z0130 Encounter for examination of blood pressure without abnormal findings: Secondary | ICD-10-CM

## 2024-09-15 DIAGNOSIS — F419 Anxiety disorder, unspecified: Secondary | ICD-10-CM

## 2024-09-15 DIAGNOSIS — F339 Major depressive disorder, recurrent, unspecified: Secondary | ICD-10-CM

## 2024-09-15 DIAGNOSIS — E66811 Obesity, class 1: Secondary | ICD-10-CM

## 2024-09-15 DIAGNOSIS — E6609 Other obesity due to excess calories: Secondary | ICD-10-CM

## 2024-09-15 DIAGNOSIS — Z6832 Body mass index (BMI) 32.0-32.9, adult: Secondary | ICD-10-CM

## 2024-09-15 MED ORDER — OZEMPIC (0.25 OR 0.5 MG/DOSE) 2 MG/1.5ML ~~LOC~~ SOPN: 0.2500 mg | PEN_INJECTOR | SUBCUTANEOUS | 3 refills | Status: DC

## 2024-09-15 MED ORDER — ROSUVASTATIN CALCIUM 5 MG PO TABS: 5.0000 mg | ORAL_TABLET | Freq: Every day | ORAL | 1 refills | Status: AC

## 2024-09-15 NOTE — Progress Notes (Signed)
 Established Patient Office Visit  Subjective:  Patient ID: Tamara Tate, female    DOB: [birth year]  Age: 38 y.o. MRN: [id number]  Chief Complaint  Patient presents with   Follow-up    2 week follow up    Patient in office for 2 week follow up, discuss recent lab results. Patient was off her medications for a few years. Restarted metformin  at previous visit. Blood work done prior to restarting metformin . LDL elevated. Will start rosuvastatin 5 mg and titrate up as tolerated.  Hgb A1c elevated. Continue metformin . Will send in Ozempic if insurance will approve it. The patient is asked to make an attempt to improve diet and exercise patterns to aid in medical management of this problem. Blood pressure well controlled today.   Patient requesting the flu vaccine.     No other concerns at this time.   Past Medical History:  Diagnosis Date   Anxiety    Convulsions/seizures (HCC) 09/10/2016   Pt was on lamictal  briefly   She had a normal EEG and MRI per report   At [facility] : [facility]     Diabetes mellitus during pregnancy, antepartum 04/15/2017   Current Diabetic Medications:  Insulin  (metformin  at start of pregnancy)   [ ]  Aspirin 81 mg daily after 12 weeks     Required Referrals for A1GDM or A2GDM:  [X]  Diabetes Education and Testing Supplies  [ ]  Nutrition Consult     For A2/B GDM or higher classes of DM  [ ]  Diabetes Education and Testing Supplies  [ ]  Nutrition Counsult  [ ]  Fetal ECHO after 20 weeks   [ ]  Eye exam for retina evaluati   Diabetes mellitus without complication (HCC)    Dizziness 07/26/2017   History of cesarean section complicating pregnancy 04/15/2017   X 2 at [facility] - first for FHR decels in labor   Second - pt had an abnormal NST and was transferred emergently for cesarean  Both infants had septal hypertrophy requiring NICU stay      Hyperlipidemia    Maternal obesity, antepartum 04/16/2017   Pregnancy, supervision, high-risk 04/15/2017   Clinic    [facility]     Prenatal Labs      Dating         Blood type:   B pos      Genetic Screen    1 Screen: desires   AFP: recommended given DMII     Quad:        Antibody: negative      Anatomic US          Rubella:  Non-immune Varicella:Immune       GTT    N/A DMII    RPR:   NR      Rhogam    N/A    HBsAg:   neg      TDaP vaccine                          Flu Shot:    HIV:   neg      Baby Food     Pt desires tubal ligation 05/06/2017   With her cesaeran     Seasonal allergies    Seizure (HCC)    UTI (urinary tract infection) 07/26/2017    Past Surgical History:  Procedure Laterality Date   CESAREAN SECTION     x 2   INNER EAR SURGERY      Social History   Socioeconomic History  Marital status: Married    Spouse name: Not on file   Number of children: 2   Years of education: BS   Highest education level: Not on file  Occupational History   Occupation: Programme Researcher, [employer]  Tobacco Use   Smoking status: Never   Smokeless tobacco: Never  Substance and Sexual Activity   Alcohol use: Yes    Comment: 1 drink per week   Drug use: No   Sexual activity: Yes    Partners: Male    Birth control/protection: None    Comment: Married  Other Topics Concern   Not on file  Social History Narrative   Lives at home w/ her husband and 2 children   Right-handed   Caffeine: 1 soda per day   Social Drivers of Corporate Investment Banker Strain: Not on file  Food Insecurity: Not on file  Transportation Needs: Not on file  Physical Activity: Not on file  Stress: Not on file  Social Connections: Not on file  Intimate Partner Violence: Not on file    Family History  Problem Relation Age of Onset   Diabetes Mother    Hypertension Mother    Asthma Father    Diabetes Maternal Grandmother    Cancer Maternal Grandfather        Leukemia   Cancer Paternal Grandfather        Prostate   Breast cancer Maternal Aunt     Allergies  Allergen Reactions   Peanut-Containing Drug Products Hives    Keppra [Levetiracetam]     Suicidal ideation   Other Hives    NUTS   Soy Allergy (Obsolete) Itching    Outpatient Medications Prior to Visit  Medication Sig   ACCU-CHEK FASTCLIX LANCETS MISC 1 Units by Percutaneous route 4 (four) times daily.   Ashwagandha (ASHWAGANDHA 35) 120 MG CAPS Take 1,300 mg by mouth daily.   Blood Glucose Monitoring Suppl (ACCU-CHEK NANO SMARTVIEW) w/Device KIT 1 kit by Subdermal route as directed. Check blood sugars for fasting, and two hours after breakfast, lunch and dinner (4 checks daily)   fluticasone (FLONASE) 50 MCG/ACT nasal spray Place 2 sprays into both nostrils daily.   glucose blood (ACCU-CHEK SMARTVIEW) test strip Use as instructed to check blood sugars   hydrOXYzine (ATARAX) 10 MG tablet Take 1 tablet (10 mg total) by mouth 3 (three) times daily as needed.   insulin  aspart (NOVOLOG ) 100 UNIT/ML FlexPen 7 units SQ with breakfast, 7 units SQ with dinner (Patient not taking: Reported on 09/01/2024)   Insulin  Detemir (LEVEMIR  FLEXPEN) 100 UNIT/ML Pen 16 units SQ before breakfast 8 units SQ before dinner (Patient not taking: Reported on 09/01/2024)   lamoTRIgine  (LAMICTAL ) 200 MG tablet Take 1 tablet (200 mg total) by mouth 2 (two) times daily. (Patient not taking: Reported on 09/01/2024)   metFORMIN  (GLUCOPHAGE  XR) 500 MG 24 hr tablet Take 1 tablet (500 mg total) by mouth daily with breakfast.   Multiple Vitamin (MULTIVITAMIN) tablet Take 1 tablet by mouth daily.   No facility-administered medications prior to visit.    Review of Systems  Constitutional: Negative.   HENT: Negative.    Eyes: Negative.   Respiratory: Negative.  Negative for shortness of breath.   Cardiovascular: Negative.  Negative for chest pain.  Gastrointestinal: Negative.  Negative for abdominal pain, constipation and diarrhea.  Genitourinary: Negative.   Musculoskeletal:  Negative for joint pain and myalgias.  Skin: Negative.   Neurological: Negative.  Negative for dizziness  and headaches.  Endo/Heme/Allergies: Negative.   All other systems reviewed and are negative.      Objective:   BP 118/72   Pulse 97   Ht 5' 2 (1.575 m)   Wt 181 lb 9.6 oz (82.4 kg)   SpO2 96%   BMI 33.22 kg/m   Vitals:   09/15/24 1309  BP: 118/72  Pulse: 97  Height: 5' 2 (1.575 m)  Weight: 181 lb 9.6 oz (82.4 kg)  SpO2: 96%  BMI (Calculated): 33.21    Physical Exam Vitals and nursing note reviewed.  Constitutional:      Appearance: Normal appearance. She is normal weight.  HENT:     Head: Normocephalic and atraumatic.     Nose: Nose normal.     Mouth/Throat:     Mouth: Mucous membranes are moist.  Eyes:     Extraocular Movements: Extraocular movements intact.     Conjunctiva/sclera: Conjunctivae normal.     Pupils: Pupils are equal, round, and reactive to light.  Cardiovascular:     Rate and Rhythm: Normal rate and regular rhythm.     Pulses: Normal pulses.     Heart sounds: Normal heart sounds.  Pulmonary:     Effort: Pulmonary effort is normal.     Breath sounds: Normal breath sounds.  Abdominal:     General: Abdomen is flat. Bowel sounds are normal.     Palpations: Abdomen is soft.  Musculoskeletal:        General: Normal range of motion.     Cervical back: Normal range of motion.  Skin:    General: Skin is warm and dry.  Neurological:     General: No focal deficit present.     Mental Status: She is alert and oriented to person, place, and time.  Psychiatric:        Mood and Affect: Mood normal.        Behavior: Behavior normal.        Thought Content: Thought content normal.        Judgment: Judgment normal.      No results found for any visits on 09/15/24.  Recent Results (from the past 2160 hours)  Basic metabolic panel     Status: Abnormal   Collection Time: 08/07/24  4:17 PM  Result Value Ref Range   Sodium 131 (L) 135 - 145 mmol/L   Potassium 3.8 3.5 - 5.1 mmol/L   Chloride 96 (L) 98 - 111 mmol/L   CO2 25 22 - 32 mmol/L    Glucose, Bld 397 (H) 70 - 99 mg/dL    Comment: Glucose reference range applies only to samples taken after fasting for at least 8 hours.   BUN 12 6 - 20 mg/dL   Creatinine, Ser 9.24 0.44 - 1.00 mg/dL   Calcium 9.7 8.9 - 89.6 mg/dL   GFR, Estimated >39 >39 mL/min    Comment: (NOTE) Calculated using the CKD-EPI Creatinine Equation (2021)    Anion gap 10 5 - 15    Comment: Performed at [facility], [address] [place], [place] [address]  CBC     Status: Abnormal   Collection Time: 08/07/24  4:17 PM  Result Value Ref Range   WBC 9.5 4.0 - 10.5 K/uL    Comment: WHITE COUNT CONFIRMED ON SMEAR   RBC 5.09 3.87 - 5.11 MIL/uL   Hemoglobin 13.1 12.0 - 15.0 g/dL   HCT 59.1 63.9 - 53.9 %   MCV 80.2 80.0 - 100.0 fL   MCH  25.7 (L) 26.0 - 34.0 pg   MCHC 32.1 30.0 - 36.0 g/dL   RDW 85.4 88.4 - 84.4 %   Platelets 343 150 - 400 K/uL   nRBC 0.0 0.0 - 0.2 %    Comment: Performed at [facility], [address] [place], [place] [address]  Troponin I (High Sensitivity)     Status: None   Collection Time: 08/07/24  4:17 PM  Result Value Ref Range   Troponin I (High Sensitivity) <2 <18 ng/L    Comment: (NOTE) Elevated high sensitivity troponin I (hsTnI) values and significant  changes across serial measurements may suggest ACS but many other  chronic and acute conditions are known to elevate hsTnI results.  Refer to the Links section for chest pain algorithms and additional  guidance. Performed at [facility], [address] [place], [place] [address]   Troponin I (High Sensitivity)     Status: None   Collection Time: 08/07/24  6:21 PM  Result Value Ref Range   Troponin I (High Sensitivity) 3 <18 ng/L    Comment: (NOTE) Elevated high sensitivity troponin I (hsTnI) values and significant  changes across serial measurements may suggest ACS but many other  chronic and acute conditions are known to elevate hsTnI results.  Refer to the Links section for  chest pain algorithms and additional  guidance. Performed at [facility], [address] [place], [place] [address]   [id number]     Status: Abnormal   Collection Time: 09/01/24 11:04 AM  Result Value Ref Range   Glucose 263 (H) 70 - 99 mg/dL   BUN 15 6 - 20 mg/dL   Creatinine, Ser 9.22 0.57 - 1.00 mg/dL   eGFR 898 >40 fO/fpw/8.26   BUN/Creatinine Ratio 19 9 - 23   Sodium 131 (L) 134 - 144 mmol/L   Potassium 4.1 3.5 - 5.2 mmol/L   Chloride 97 96 - 106 mmol/L   CO2 21 20 - 29 mmol/L   Calcium 9.4 8.7 - 10.2 mg/dL   Total Protein 7.4 6.0 - 8.5 g/dL   Albumin 4.0 3.9 - 4.9 g/dL   Globulin, Total 3.4 1.5 - 4.5 g/dL   Bilirubin Total 0.3 0.0 - 1.2 mg/dL   Alkaline Phosphatase 72 41 - 116 IU/L   AST 20 0 - 40 IU/L   ALT 28 0 - 32 IU/L  Lipid Profile     Status: Abnormal   Collection Time: 09/01/24 11:04 AM  Result Value Ref Range   Cholesterol, Total 295 (H) 100 - 199 mg/dL   Triglycerides 779 (H) 0 - 149 mg/dL   HDL 65 >60 mg/dL   VLDL Cholesterol Cal 42 (H) 5 - 40 mg/dL   LDL Chol Calc (NIH) 811 (H) 0 - 99 mg/dL   LDL CALC COMMENT: Comment     Comment: Consider evaluating for Familial Hypercholesterolemia(FH), if clinically indicated.    Chol/HDL Ratio 4.5 (H) 0.0 - 4.4 ratio    Comment:                                   T. Chol/HDL Ratio                                             Men  Women  1/2 Avg.Risk  3.4    3.3                                   Avg.Risk  5.0    4.4                                2X Avg.Risk  9.6    7.1                                3X Avg.Risk 23.4   11.0   TSH     Status: None   Collection Time: 09/01/24 11:04 AM  Result Value Ref Range   TSH 1.270 0.450 - 4.500 uIU/mL  Hemoglobin A1c     Status: Abnormal   Collection Time: 09/01/24 11:04 AM  Result Value Ref Range   Hgb A1c MFr Bld 14.5 (H) 4.8 - 5.6 %    Comment:          Prediabetes: 5.7 - 6.4          Diabetes: >6.4          Glycemic control for  adults with diabetes: <7.0    Est. average glucose Bld gHb Est-mCnc 369 mg/dL      Assessment & Plan:  Rosuvastatin 5 mg daily Ozempic 0.25 mg Flu vaccine today The patient is asked to make an attempt to improve diet and exercise patterns to aid in medical management of this problem.  Problem List Items Addressed This Visit       Endocrine   Type 2 diabetes mellitus with hyperglycemia, without long-term current use of insulin  (HCC)   Relevant Medications   rosuvastatin (CRESTOR) 5 MG tablet   Semaglutide,0.25 or 0.5MG /DOS, (OZEMPIC, 0.25 OR 0.5 MG/DOSE,) 2 MG/1.5ML SOPN     Other   Depression, recurrent   Hyperlipidemia   Relevant Medications   rosuvastatin (CRESTOR) 5 MG tablet   Anxiety   Class 1 obesity due to excess calories with serious comorbidity and body mass index (BMI) of 32.0 to 32.9 in adult   Relevant Medications   Semaglutide,0.25 or 0.5MG /DOS, (OZEMPIC, 0.25 OR 0.5 MG/DOSE,) 2 MG/1.5ML SOPN   Other Visit Diagnoses       Flu vaccine need    -  Primary   Relevant Orders   Influenza, MDCK, trivalent, PF(Flucelvax egg-free) (Completed)       Return in about 4 weeks (around 10/13/2024).   Total time spent: 25 minutes. This time includes review of previous notes and results and patient face to face interaction during today's visit.    Jeoffrey Pollen, NP  09/15/2024   This document may have been prepared by Dragon Voice Recognition software and as such may include unintentional dictation errors.

## 2024-10-13 ENCOUNTER — Ambulatory Visit: Admitting: Cardiology

## 2024-10-16 ENCOUNTER — Ambulatory Visit: Admitting: Cardiology

## 2024-10-30 ENCOUNTER — Ambulatory Visit: Admitting: Cardiology

## 2024-10-30 ENCOUNTER — Encounter: Payer: Self-pay | Admitting: Cardiology

## 2024-10-30 VITALS — BP 123/78 | HR 95 | Ht 62.0 in | Wt 180.8 lb

## 2024-10-30 DIAGNOSIS — E6609 Other obesity due to excess calories: Secondary | ICD-10-CM

## 2024-10-30 DIAGNOSIS — E1165 Type 2 diabetes mellitus with hyperglycemia: Secondary | ICD-10-CM | POA: Diagnosis not present

## 2024-10-30 DIAGNOSIS — E782 Mixed hyperlipidemia: Secondary | ICD-10-CM

## 2024-10-30 DIAGNOSIS — E66811 Obesity, class 1: Secondary | ICD-10-CM | POA: Diagnosis not present

## 2024-10-30 DIAGNOSIS — Z0130 Encounter for examination of blood pressure without abnormal findings: Secondary | ICD-10-CM

## 2024-10-30 DIAGNOSIS — F419 Anxiety disorder, unspecified: Secondary | ICD-10-CM

## 2024-10-30 DIAGNOSIS — Z6832 Body mass index (BMI) 32.0-32.9, adult: Secondary | ICD-10-CM | POA: Diagnosis not present

## 2024-10-30 MED ORDER — OZEMPIC (0.25 OR 0.5 MG/DOSE) 2 MG/1.5ML ~~LOC~~ SOPN: 0.2500 mg | PEN_INJECTOR | SUBCUTANEOUS | 6 refills | Status: DC

## 2024-10-30 MED ORDER — BUSPIRONE HCL 5 MG PO TABS: 5.0000 mg | ORAL_TABLET | Freq: Three times a day (TID) | ORAL | 0 refills | Status: AC

## 2024-10-30 MED ORDER — METFORMIN HCL ER 500 MG PO TB24: 500.0000 mg | ORAL_TABLET | Freq: Every day | ORAL | 0 refills | Status: DC

## 2024-10-30 NOTE — Progress Notes (Signed)
 "  Established Patient Office Visit  Subjective:  Patient ID: Tamara Tate, female    DOB: January 25, 1986  Age: 38 y.o. MRN: [id number]  Chief Complaint  Patient presents with   Follow-up    Follow up    Patient in office for 4 week follow up. Patient doing well, no new complaints today. Started on Ozempic  at previous visit. Blood sugars improved. Will increase to 0.5 mg weekly.  Patient reports hydroxyzine  makes her groggy for days after taking it. Will send in buspirone  for anxiety.  Pap smear 2024 normal per patient.   Blood pressure well controlled today. Continue all other medications.     No other concerns at this time.   Past Medical History:  Diagnosis Date   Anxiety    Convulsions/seizures (HCC) 09/10/2016   Pt was on lamictal  briefly   She had a normal EEG and MRI per report   At [facility]     Diabetes mellitus during pregnancy, antepartum 04/15/2017   Current Diabetic Medications:  Insulin  (metformin  at start of pregnancy)   [ ]  Aspirin 81 mg daily after 12 weeks     Required Referrals for A1GDM or A2GDM:  [X]  Diabetes Education and Testing Supplies  [ ]  Nutrition Consult     For A2/B GDM or higher classes of DM  [ ]  Diabetes Education and Testing Supplies  [ ]  Nutrition Counsult  [ ]  Fetal ECHO after 20 weeks   [ ]  Eye exam for retina evaluati   Diabetes mellitus without complication (HCC)    Dizziness 07/26/2017   History of cesarean section complicating pregnancy 04/15/2017   X 2 at [facility] - first for FHR decels in labor   Second - pt had an abnormal NST and was transferred emergently for cesarean  Both infants had septal hypertrophy requiring NICU stay      Hyperlipidemia    Maternal obesity, antepartum 04/16/2017   Pregnancy, supervision, high-risk 04/15/2017   Clinic    [facility]    Prenatal Labs      Dating         Blood type:   B pos      Genetic Screen    1 Screen: desires   AFP: recommended given DMII     Quad:        Antibody: negative      Anatomic US           Rubella:  Non-immune Varicella:Immune       GTT    N/A DMII    RPR:   NR      Rhogam    N/A    HBsAg:   neg      TDaP vaccine                          Flu Shot:    HIV:   neg      Baby Food     Pt desires tubal ligation 05/06/2017   With her cesaeran     Seasonal allergies    Seizure (HCC)    UTI (urinary tract infection) 07/26/2017    Past Surgical History:  Procedure Laterality Date   CESAREAN SECTION     x 2   INNER EAR SURGERY      Social History   Socioeconomic History   Marital status: Married    Spouse name: Not on file   Number of children: 2   Years of education: BS   Highest education  level: Not on file  Occupational History   Occupation: Programme Researcher, [employer]  Tobacco Use   Smoking status: Never   Smokeless tobacco: Never  Substance and Sexual Activity   Alcohol use: Yes    Comment: 1 drink per week   Drug use: No   Sexual activity: Yes    Partners: Male    Birth control/protection: None    Comment: Married  Other Topics Concern   Not on file  Social History Narrative   Lives at home w/ her husband and 2 children   Right-handed   Caffeine: 1 soda per day   Social Drivers of Health   Tobacco Use: Low Risk (10/30/2024)   Patient History    Smoking Tobacco Use: Never    Smokeless Tobacco Use: Never    Passive Exposure: Not on file  Financial Resource Strain: Not on file  Food Insecurity: Not on file  Transportation Needs: Not on file  Physical Activity: Not on file  Stress: Not on file  Social Connections: Not on file  Intimate Partner Violence: Not on file  Depression (PHQ2-9): Low Risk (09/01/2024)   Depression (PHQ2-9)    PHQ-2 Score: 4  Alcohol Screen: Not on file  Housing: Not on file  Utilities: Not on file  Health Literacy: Not on file    Family History  Problem Relation Age of Onset   Diabetes Mother    Hypertension Mother    Asthma Father    Diabetes Maternal Grandmother    Cancer Maternal Grandfather         Leukemia   Cancer Paternal Grandfather        Prostate   Breast cancer Maternal Aunt     Allergies[1]  Show/hide medication list[2]  Review of Systems  Constitutional: Negative.   HENT: Negative.    Eyes: Negative.   Respiratory: Negative.  Negative for shortness of breath.   Cardiovascular: Negative.  Negative for chest pain.  Gastrointestinal: Negative.  Negative for abdominal pain, constipation and diarrhea.  Genitourinary: Negative.   Musculoskeletal:  Negative for joint pain and myalgias.  Skin: Negative.   Neurological: Negative.  Negative for dizziness and headaches.  Endo/Heme/Allergies: Negative.   Psychiatric/Behavioral:  The patient is nervous/anxious.   All other systems reviewed and are negative.      Objective:   BP 123/78   Pulse 95   Ht 5' 2 (1.575 m)   Wt 180 lb 12.8 oz (82 kg)   SpO2 98%   BMI 33.07 kg/m   Vitals:   10/30/24 0944  BP: 123/78  Pulse: 95  Height: 5' 2 (1.575 m)  Weight: 180 lb 12.8 oz (82 kg)  SpO2: 98%  BMI (Calculated): 33.06    Physical Exam Vitals and nursing note reviewed.  Constitutional:      Appearance: Normal appearance. She is normal weight.  HENT:     Head: Normocephalic and atraumatic.     Nose: Nose normal.     Mouth/Throat:     Mouth: Mucous membranes are moist.  Eyes:     Extraocular Movements: Extraocular movements intact.     Conjunctiva/sclera: Conjunctivae normal.     Pupils: Pupils are equal, round, and reactive to light.  Cardiovascular:     Rate and Rhythm: Normal rate and regular rhythm.     Pulses: Normal pulses.     Heart sounds: Normal heart sounds.  Pulmonary:     Effort: Pulmonary effort is normal.     Breath sounds: Normal breath sounds.  Abdominal:     General: Abdomen is flat. Bowel sounds are normal.     Palpations: Abdomen is soft.  Musculoskeletal:        General: Normal range of motion.     Cervical back: Normal range of motion.  Skin:    General: Skin is warm and dry.   Neurological:     General: No focal deficit present.     Mental Status: She is alert and oriented to person, place, and time.  Psychiatric:        Mood and Affect: Mood normal.        Behavior: Behavior normal.        Thought Content: Thought content normal.        Judgment: Judgment normal.      No results found for any visits on 10/30/24.  Recent Results (from the past 2160 hours)  Basic metabolic panel     Status: Abnormal   Collection Time: 08/07/24  4:17 PM  Result Value Ref Range   Sodium 131 (L) 135 - 145 mmol/L   Potassium 3.8 3.5 - 5.1 mmol/L   Chloride 96 (L) 98 - 111 mmol/L   CO2 25 22 - 32 mmol/L   Glucose, Bld 397 (H) 70 - 99 mg/dL    Comment: Glucose reference range applies only to samples taken after fasting for at least 8 hours.   BUN 12 6 - 20 mg/dL   Creatinine, Ser 9.24 0.44 - 1.00 mg/dL   Calcium  9.7 8.9 - 10.3 mg/dL   GFR, Estimated >39 >39 mL/min    Comment: (NOTE) Calculated using the CKD-EPI Creatinine Equation (2021)    Anion gap 10 5 - 15    Comment: Performed at [facility], [address] [place], [place] [address]  CBC     Status: Abnormal   Collection Time: 08/07/24  4:17 PM  Result Value Ref Range   WBC 9.5 4.0 - 10.5 K/uL    Comment: WHITE COUNT CONFIRMED ON SMEAR   RBC 5.09 3.87 - 5.11 MIL/uL   Hemoglobin 13.1 12.0 - 15.0 g/dL   HCT 59.1 63.9 - 53.9 %   MCV 80.2 80.0 - 100.0 fL   MCH 25.7 (L) 26.0 - 34.0 pg   MCHC 32.1 30.0 - 36.0 g/dL   RDW 85.4 88.4 - 84.4 %   Platelets 343 150 - 400 K/uL   nRBC 0.0 0.0 - 0.2 %    Comment: Performed at [facility], [address] [place], [place] [address]  Troponin I (High Sensitivity)     Status: None   Collection Time: 08/07/24  4:17 PM  Result Value Ref Range   Troponin I (High Sensitivity) <2 <18 ng/L    Comment: (NOTE) Elevated high sensitivity troponin I (hsTnI) values and significant  changes across serial measurements may suggest ACS but many other  chronic  and acute conditions are known to elevate hsTnI results.  Refer to the Links section for chest pain algorithms and additional  guidance. Performed at [facility], [address] [place], [place] [address]   Troponin I (High Sensitivity)     Status: None   Collection Time: 08/07/24  6:21 PM  Result Value Ref Range   Troponin I (High Sensitivity) 3 <18 ng/L    Comment: (NOTE) Elevated high sensitivity troponin I (hsTnI) values and significant  changes across serial measurements may suggest ACS but many other  chronic and acute conditions are known to elevate hsTnI results.  Refer to the Links section for chest pain algorithms and additional  guidance. Performed at [facility], [address] [place], [place] [address]   [id number]     Status: Abnormal   Collection Time: 09/01/24 11:04 AM  Result Value Ref Range   Glucose 263 (H) 70 - 99 mg/dL   BUN 15 6 - 20 mg/dL   Creatinine, Ser 9.22 0.57 - 1.00 mg/dL   eGFR 898 >40 fO/fpw/8.26   BUN/Creatinine Ratio 19 9 - 23   Sodium 131 (L) 134 - 144 mmol/L   Potassium 4.1 3.5 - 5.2 mmol/L   Chloride 97 96 - 106 mmol/L   CO2 21 20 - 29 mmol/L   Calcium  9.4 8.7 - 10.2 mg/dL   Total Protein 7.4 6.0 - 8.5 g/dL   Albumin 4.0 3.9 - 4.9 g/dL   Globulin, Total 3.4 1.5 - 4.5 g/dL   Bilirubin Total 0.3 0.0 - 1.2 mg/dL   Alkaline Phosphatase 72 41 - 116 IU/L   AST 20 0 - 40 IU/L   ALT 28 0 - 32 IU/L  Lipid Profile     Status: Abnormal   Collection Time: 09/01/24 11:04 AM  Result Value Ref Range   Cholesterol, Total 295 (H) 100 - 199 mg/dL   Triglycerides 779 (H) 0 - 149 mg/dL   HDL 65 >60 mg/dL   VLDL Cholesterol Cal 42 (H) 5 - 40 mg/dL   LDL Chol Calc (NIH) 811 (H) 0 - 99 mg/dL   LDL CALC COMMENT: Comment     Comment: Consider evaluating for Familial Hypercholesterolemia(FH), if clinically indicated.    Chol/HDL Ratio 4.5 (H) 0.0 - 4.4 ratio    Comment:                                   T. Chol/HDL Ratio                                              Men  Women                               1/2 Avg.Risk  3.4    3.3                                   Avg.Risk  5.0    4.4                                2X Avg.Risk  9.6    7.1                                3X Avg.Risk 23.4   11.0   TSH     Status: None   Collection Time: 09/01/24 11:04 AM  Result Value Ref Range   TSH 1.270 0.450 - 4.500 uIU/mL  Hemoglobin A1c     Status: Abnormal   Collection Time: 09/01/24 11:04 AM  Result Value Ref Range   Hgb A1c MFr Bld 14.5 (H) 4.8 - 5.6 %    Comment:  Prediabetes: 5.7 - 6.4          Diabetes: >6.4          Glycemic control for adults with diabetes: <7.0    Est. average glucose Bld gHb Est-mCnc 369 mg/dL      Assessment & Plan:  Increase Ozempic  to 0.5 mg weekly Buspirone  for anxiety Continue all other medications  Problem List Items Addressed This Visit       Endocrine   Type 2 diabetes mellitus with hyperglycemia, without long-term current use of insulin  (HCC) - Primary   Relevant Medications   Semaglutide ,0.25 or 0.5MG /DOS, (OZEMPIC , 0.25 OR 0.5 MG/DOSE,) 2 MG/1.5ML SOPN   metFORMIN  (GLUCOPHAGE  XR) 500 MG 24 hr tablet     Other   Hyperlipidemia   Anxiety   Relevant Medications   busPIRone  (BUSPAR ) 5 MG tablet   Class 1 obesity due to excess calories with serious comorbidity and body mass index (BMI) of 32.0 to 32.9 in adult   Relevant Medications   Semaglutide ,0.25 or 0.5MG /DOS, (OZEMPIC , 0.25 OR 0.5 MG/DOSE,) 2 MG/1.5ML SOPN   metFORMIN  (GLUCOPHAGE  XR) 500 MG 24 hr tablet    Return in about 10 weeks (around 01/08/2025) for fasting lab work prior.   Total time spent: 25 minutes. This time includes review of previous notes and results and patient face to face interaction during today's visit.    Jeoffrey Pollen, NP  10/30/2024   This document may have been prepared by [facility] Voice Recognition software and as such may include unintentional dictation errors.     [1]   Allergies Allergen Reactions   Peanut-Containing Drug Products Hives   Keppra [Levetiracetam]     Suicidal ideation   Other Hives    NUTS   Soy Allergy (Obsolete) Itching  [2]  Outpatient Medications Prior to Visit  Medication Sig   ACCU-CHEK FASTCLIX LANCETS MISC 1 Units by Percutaneous route 4 (four) times daily.   Ashwagandha (ASHWAGANDHA 35) 120 MG CAPS Take 1,300 mg by mouth daily.   Blood Glucose Monitoring Suppl (ACCU-CHEK NANO SMARTVIEW) w/Device KIT 1 kit by Subdermal route as directed. Check blood sugars for fasting, and two hours after breakfast, lunch and dinner (4 checks daily)   fluticasone (FLONASE) 50 MCG/ACT nasal spray Place 2 sprays into both nostrils daily.   glucose blood (ACCU-CHEK SMARTVIEW) test strip Use as instructed to check blood sugars   Multiple Vitamin (MULTIVITAMIN) tablet Take 1 tablet by mouth daily.   rosuvastatin  (CRESTOR ) 5 MG tablet Take 1 tablet (5 mg total) by mouth daily.   [DISCONTINUED] hydrOXYzine  (ATARAX ) 10 MG tablet Take 1 tablet (10 mg total) by mouth 3 (three) times daily as needed.   [DISCONTINUED] metFORMIN  (GLUCOPHAGE  XR) 500 MG 24 hr tablet Take 1 tablet (500 mg total) by mouth daily with breakfast.   [DISCONTINUED] Semaglutide ,0.25 or 0.5MG /DOS, (OZEMPIC , 0.25 OR 0.5 MG/DOSE,) 2 MG/1.5ML SOPN Inject 0.25 mg into the skin once a week.   lamoTRIgine  (LAMICTAL ) 200 MG tablet Take 1 tablet (200 mg total) by mouth 2 (two) times daily. (Patient not taking: Reported on 10/30/2024)   [DISCONTINUED] insulin  aspart (NOVOLOG ) 100 UNIT/ML FlexPen 7 units SQ with breakfast, 7 units SQ with dinner (Patient not taking: Reported on 10/30/2024)   [DISCONTINUED] Insulin  Detemir (LEVEMIR  FLEXPEN) 100 UNIT/ML Pen 16 units SQ before breakfast 8 units SQ before dinner (Patient not taking: Reported on 10/30/2024)   No facility-administered medications prior to visit.   "

## 2024-11-19 ENCOUNTER — Encounter: Payer: Self-pay | Admitting: Cardiology

## 2024-11-20 ENCOUNTER — Other Ambulatory Visit: Payer: Self-pay | Admitting: Cardiology

## 2024-11-20 MED ORDER — OZEMPIC (0.25 OR 0.5 MG/DOSE) 2 MG/1.5ML ~~LOC~~ SOPN: 0.5000 mg | PEN_INJECTOR | SUBCUTANEOUS | 2 refills | Status: DC

## 2024-11-20 MED ORDER — METFORMIN HCL ER 500 MG PO TB24: 500.0000 mg | ORAL_TABLET | Freq: Every day | ORAL | 0 refills | Status: AC

## 2024-11-24 ENCOUNTER — Other Ambulatory Visit: Payer: Self-pay | Admitting: Cardiology

## 2024-11-24 MED ORDER — SEMAGLUTIDE (1 MG/DOSE) 4 MG/3ML ~~LOC~~ SOPN: 1.0000 mg | PEN_INJECTOR | SUBCUTANEOUS | 4 refills | Status: AC

## 2025-01-09 ENCOUNTER — Ambulatory Visit: Admitting: Cardiology
